# Patient Record
Sex: Female | Born: 1935 | Race: White | Hispanic: No | Marital: Married | Smoking: Never smoker
Health system: Southern US, Community
[De-identification: ages and names within clinical notes are randomized; demographics above are authoritative.]

## PROBLEM LIST (undated history)

## (undated) DIAGNOSIS — D649 Anemia, unspecified: Secondary | ICD-10-CM

## (undated) DIAGNOSIS — R413 Other amnesia: Secondary | ICD-10-CM

## (undated) DIAGNOSIS — F0280 Dementia in other diseases classified elsewhere without behavioral disturbance: Secondary | ICD-10-CM

## (undated) DIAGNOSIS — E538 Deficiency of other specified B group vitamins: Secondary | ICD-10-CM

## (undated) DIAGNOSIS — G9341 Metabolic encephalopathy: Secondary | ICD-10-CM

## (undated) DIAGNOSIS — I2510 Atherosclerotic heart disease of native coronary artery without angina pectoris: Secondary | ICD-10-CM

## (undated) DIAGNOSIS — M1990 Unspecified osteoarthritis, unspecified site: Secondary | ICD-10-CM

## (undated) DIAGNOSIS — I10 Essential (primary) hypertension: Secondary | ICD-10-CM

## (undated) DIAGNOSIS — R41841 Cognitive communication deficit: Secondary | ICD-10-CM

## (undated) DIAGNOSIS — R1310 Dysphagia, unspecified: Secondary | ICD-10-CM

## (undated) DIAGNOSIS — E785 Hyperlipidemia, unspecified: Secondary | ICD-10-CM

## (undated) DIAGNOSIS — IMO0002 Reserved for concepts with insufficient information to code with codable children: Secondary | ICD-10-CM

## (undated) HISTORY — DX: Other amnesia: R41.3

## (undated) HISTORY — DX: Unspecified osteoarthritis, unspecified site: M19.90

## (undated) HISTORY — DX: Essential (primary) hypertension: I10

## (undated) HISTORY — DX: Atherosclerotic heart disease of native coronary artery without angina pectoris: I25.10

## (undated) HISTORY — DX: Deficiency of other specified B group vitamins: E53.8

## (undated) HISTORY — DX: Reserved for concepts with insufficient information to code with codable children: IMO0002

## (undated) HISTORY — DX: Hyperlipidemia, unspecified: E78.5

---

## 1990-12-15 HISTORY — PX: CARDIAC CATHETERIZATION: SHX172

## 1999-03-19 ENCOUNTER — Other Ambulatory Visit: Admission: RE | Admit: 1999-03-19 | Discharge: 1999-03-19 | Payer: Self-pay | Admitting: *Deleted

## 1999-04-08 ENCOUNTER — Other Ambulatory Visit: Admission: RE | Admit: 1999-04-08 | Discharge: 1999-04-08 | Payer: Self-pay | Admitting: *Deleted

## 2000-08-31 ENCOUNTER — Other Ambulatory Visit: Admission: RE | Admit: 2000-08-31 | Discharge: 2000-08-31 | Payer: Self-pay | Admitting: *Deleted

## 2000-10-28 ENCOUNTER — Encounter: Payer: Self-pay | Admitting: Orthopedic Surgery

## 2000-10-30 ENCOUNTER — Ambulatory Visit (HOSPITAL_COMMUNITY): Admission: RE | Admit: 2000-10-30 | Discharge: 2000-10-30 | Payer: Self-pay | Admitting: Orthopedic Surgery

## 2001-11-01 ENCOUNTER — Ambulatory Visit (HOSPITAL_COMMUNITY): Admission: RE | Admit: 2001-11-01 | Discharge: 2001-11-01 | Payer: Self-pay | Admitting: *Deleted

## 2001-11-01 ENCOUNTER — Encounter: Payer: Self-pay | Admitting: *Deleted

## 2004-06-21 ENCOUNTER — Ambulatory Visit (HOSPITAL_COMMUNITY): Admission: RE | Admit: 2004-06-21 | Discharge: 2004-06-21 | Payer: Self-pay | Admitting: Cardiology

## 2004-10-15 ENCOUNTER — Ambulatory Visit: Payer: Self-pay | Admitting: Family Medicine

## 2004-10-31 ENCOUNTER — Ambulatory Visit: Payer: Self-pay | Admitting: Family Medicine

## 2004-11-12 ENCOUNTER — Ambulatory Visit: Payer: Self-pay | Admitting: Family Medicine

## 2004-11-26 ENCOUNTER — Ambulatory Visit: Payer: Self-pay | Admitting: *Deleted

## 2004-12-05 ENCOUNTER — Ambulatory Visit: Payer: Self-pay | Admitting: Internal Medicine

## 2005-01-13 ENCOUNTER — Ambulatory Visit: Payer: Self-pay | Admitting: Family Medicine

## 2005-01-21 ENCOUNTER — Ambulatory Visit: Payer: Self-pay | Admitting: Family Medicine

## 2005-02-04 ENCOUNTER — Ambulatory Visit: Payer: Self-pay | Admitting: Family Medicine

## 2005-04-02 ENCOUNTER — Ambulatory Visit: Payer: Self-pay | Admitting: *Deleted

## 2005-04-29 ENCOUNTER — Ambulatory Visit: Payer: Self-pay | Admitting: Cardiology

## 2005-05-01 ENCOUNTER — Ambulatory Visit: Payer: Self-pay | Admitting: Cardiology

## 2005-10-14 ENCOUNTER — Ambulatory Visit: Payer: Self-pay | Admitting: *Deleted

## 2005-10-21 ENCOUNTER — Ambulatory Visit: Payer: Self-pay | Admitting: *Deleted

## 2005-10-23 ENCOUNTER — Ambulatory Visit: Payer: Self-pay | Admitting: Cardiology

## 2005-10-28 ENCOUNTER — Ambulatory Visit: Payer: Self-pay | Admitting: *Deleted

## 2006-04-07 ENCOUNTER — Ambulatory Visit: Payer: Self-pay | Admitting: *Deleted

## 2006-04-20 ENCOUNTER — Ambulatory Visit: Payer: Self-pay | Admitting: *Deleted

## 2006-04-23 ENCOUNTER — Ambulatory Visit: Payer: Self-pay | Admitting: Cardiology

## 2006-09-30 ENCOUNTER — Ambulatory Visit: Payer: Self-pay | Admitting: *Deleted

## 2006-10-19 ENCOUNTER — Ambulatory Visit: Payer: Self-pay | Admitting: *Deleted

## 2006-10-19 LAB — CONVERTED CEMR LAB
ALT: 19 units/L (ref 0–40)
AST: 21 units/L (ref 0–37)
Albumin: 3.6 g/dL (ref 3.5–5.2)
Alkaline Phosphatase: 106 units/L (ref 39–117)
BUN: 16 mg/dL (ref 6–23)
Bilirubin, Direct: 0.1 mg/dL (ref 0.0–0.3)
CO2: 28 meq/L (ref 19–32)
Calcium: 9.5 mg/dL (ref 8.4–10.5)
Chloride: 105 meq/L (ref 96–112)
Chol/HDL Ratio, serum: 3.1
Cholesterol: 165 mg/dL (ref 0–200)
Creatinine, Ser: 0.9 mg/dL (ref 0.4–1.2)
GFR calc non Af Amer: 65 mL/min
Glomerular Filtration Rate, Af Am: 78 mL/min/{1.73_m2}
Glucose, Bld: 110 mg/dL — ABNORMAL HIGH (ref 70–99)
HDL: 53.6 mg/dL (ref >39.0)
LDL Cholesterol: 89 mg/dL (ref 0–99)
Potassium: 3.8 meq/L (ref 3.5–5.1)
Sodium: 140 meq/L (ref 135–145)
Total Bilirubin: 0.9 mg/dL (ref 0.3–1.2)
Total Protein: 6.6 g/dL (ref 6.0–8.3)
Triglyceride fasting, serum: 112 mg/dL (ref 0–149)
VLDL: 22 mg/dL (ref 0–40)

## 2006-10-22 ENCOUNTER — Ambulatory Visit: Payer: Self-pay | Admitting: Internal Medicine

## 2006-10-27 ENCOUNTER — Ambulatory Visit: Payer: Self-pay | Admitting: *Deleted

## 2007-01-25 ENCOUNTER — Ambulatory Visit: Payer: Self-pay | Admitting: *Deleted

## 2007-04-14 ENCOUNTER — Ambulatory Visit: Payer: Self-pay | Admitting: *Deleted

## 2007-04-14 LAB — CONVERTED CEMR LAB
ALT: 18 units/L (ref 0–40)
AST: 20 units/L (ref 0–37)
Albumin: 3.8 g/dL (ref 3.5–5.2)
Alkaline Phosphatase: 121 units/L — ABNORMAL HIGH (ref 39–117)
BUN: 13 mg/dL (ref 6–23)
Bilirubin, Direct: 0.2 mg/dL (ref 0.0–0.3)
CO2: 29 meq/L (ref 19–32)
Calcium: 9.1 mg/dL (ref 8.4–10.5)
Chloride: 103 meq/L (ref 96–112)
Cholesterol: 164 mg/dL (ref 0–200)
Creatinine, Ser: 0.9 mg/dL (ref 0.4–1.2)
GFR calc Af Amer: 78 mL/min
GFR calc non Af Amer: 65 mL/min
Glucose, Bld: 116 mg/dL — ABNORMAL HIGH (ref 70–99)
HDL: 49.2 mg/dL (ref >39.0)
LDL Cholesterol: 87 mg/dL (ref 0–99)
Potassium: 3.7 meq/L (ref 3.5–5.1)
Sodium: 142 meq/L (ref 135–145)
Total Bilirubin: 1.1 mg/dL (ref 0.3–1.2)
Total CHOL/HDL Ratio: 3.3
Total Protein: 6.7 g/dL (ref 6.0–8.3)
Triglycerides: 138 mg/dL (ref 0–149)
VLDL: 28 mg/dL (ref 0–40)

## 2007-04-19 ENCOUNTER — Ambulatory Visit: Payer: Self-pay | Admitting: Cardiovascular Disease

## 2007-04-27 ENCOUNTER — Ambulatory Visit: Payer: Self-pay | Admitting: *Deleted

## 2007-06-09 ENCOUNTER — Ambulatory Visit: Payer: Self-pay | Admitting: Cardiology

## 2007-06-09 LAB — CONVERTED CEMR LAB
ALT: 15 units/L (ref 0–40)
AST: 22 units/L (ref 0–37)
Albumin: 3.5 g/dL (ref 3.5–5.2)
Alkaline Phosphatase: 125 units/L — ABNORMAL HIGH (ref 39–117)
Bilirubin, Direct: 0.2 mg/dL (ref 0.0–0.3)
Cholesterol: 196 mg/dL (ref 0–200)
HDL: 53.9 mg/dL (ref >39.0)
LDL Cholesterol: 119 mg/dL — ABNORMAL HIGH (ref 0–99)
Total Bilirubin: 1.2 mg/dL (ref 0.3–1.2)
Total CHOL/HDL Ratio: 3.6
Total Protein: 7 g/dL (ref 6.0–8.3)
Triglycerides: 114 mg/dL (ref 0–149)
VLDL: 23 mg/dL (ref 0–40)

## 2007-08-30 ENCOUNTER — Encounter: Payer: Self-pay | Admitting: Family Medicine

## 2007-09-02 ENCOUNTER — Ambulatory Visit: Payer: Self-pay | Admitting: Cardiology

## 2007-10-14 ENCOUNTER — Ambulatory Visit: Payer: Self-pay | Admitting: Cardiology

## 2007-10-14 LAB — CONVERTED CEMR LAB
ALT: 18 units/L (ref 0–35)
AST: 21 units/L (ref 0–37)
Albumin: 3.8 g/dL (ref 3.5–5.2)
Alkaline Phosphatase: 118 units/L — ABNORMAL HIGH (ref 39–117)
Bilirubin, Direct: 0.1 mg/dL (ref 0.0–0.3)
Cholesterol: 148 mg/dL (ref 0–200)
HDL: 55.3 mg/dL (ref >39.0)
LDL Cholesterol: 73 mg/dL (ref 0–99)
Total Bilirubin: 1 mg/dL (ref 0.3–1.2)
Total CHOL/HDL Ratio: 2.7
Total Protein: 6.9 g/dL (ref 6.0–8.3)
Triglycerides: 98 mg/dL (ref 0–149)
VLDL: 20 mg/dL (ref 0–40)

## 2007-10-21 ENCOUNTER — Ambulatory Visit: Payer: Self-pay | Admitting: Cardiology

## 2007-10-29 ENCOUNTER — Ambulatory Visit: Payer: Self-pay | Admitting: Cardiology

## 2007-10-29 LAB — CONVERTED CEMR LAB
ALT: 17 units/L (ref 0–35)
AST: 21 units/L (ref 0–37)
Albumin: 3.7 g/dL (ref 3.5–5.2)
Alkaline Phosphatase: 123 units/L — ABNORMAL HIGH (ref 39–117)
Bilirubin, Direct: 0.2 mg/dL (ref 0.0–0.3)
Cholesterol: 171 mg/dL (ref 0–200)
HDL: 53.7 mg/dL (ref >39.0)
LDL Cholesterol: 95 mg/dL (ref 0–99)
Total Bilirubin: 1 mg/dL (ref 0.3–1.2)
Total CHOL/HDL Ratio: 3.2
Total Protein: 6.9 g/dL (ref 6.0–8.3)
Triglycerides: 113 mg/dL (ref 0–149)
VLDL: 23 mg/dL (ref 0–40)

## 2008-02-18 ENCOUNTER — Ambulatory Visit: Payer: Self-pay | Admitting: Cardiology

## 2008-02-18 LAB — CONVERTED CEMR LAB
ALT: 19 units/L (ref 0–35)
AST: 22 units/L (ref 0–37)
Albumin: 4.1 g/dL (ref 3.5–5.2)
Alkaline Phosphatase: 114 units/L (ref 39–117)
BUN: 22 mg/dL (ref 6–23)
Bilirubin, Direct: 0.1 mg/dL (ref 0.0–0.3)
CO2: 28 meq/L (ref 19–32)
Calcium: 9.9 mg/dL (ref 8.4–10.5)
Chloride: 104 meq/L (ref 96–112)
Cholesterol: 162 mg/dL (ref 0–200)
Creatinine, Ser: 1.1 mg/dL (ref 0.4–1.2)
GFR calc Af Amer: 62 mL/min
GFR calc non Af Amer: 51 mL/min
Glucose, Bld: 105 mg/dL — ABNORMAL HIGH (ref 70–99)
HDL: 55.6 mg/dL (ref >39.0)
LDL Cholesterol: 80 mg/dL (ref 0–99)
Potassium: 3.9 meq/L (ref 3.5–5.1)
Sodium: 138 meq/L (ref 135–145)
Total Bilirubin: 1.2 mg/dL (ref 0.3–1.2)
Total CHOL/HDL Ratio: 2.9
Total Protein: 7.3 g/dL (ref 6.0–8.3)
Triglycerides: 131 mg/dL (ref 0–149)
VLDL: 26 mg/dL (ref 0–40)

## 2008-04-06 ENCOUNTER — Ambulatory Visit: Payer: Self-pay | Admitting: Cardiology

## 2008-06-23 ENCOUNTER — Ambulatory Visit: Payer: Self-pay | Admitting: Cardiology

## 2008-06-23 LAB — CONVERTED CEMR LAB
ALT: 17 units/L (ref 0–35)
AST: 19 units/L (ref 0–37)
Albumin: 3.6 g/dL (ref 3.5–5.2)
Alkaline Phosphatase: 128 units/L — ABNORMAL HIGH (ref 39–117)
Bilirubin, Direct: 0.1 mg/dL (ref 0.0–0.3)
Cholesterol: 152 mg/dL (ref 0–200)
HDL: 56.5 mg/dL (ref >39.0)
LDL Cholesterol: 77 mg/dL (ref 0–99)
Total Bilirubin: 1.1 mg/dL (ref 0.3–1.2)
Total CHOL/HDL Ratio: 2.7
Total Protein: 6.7 g/dL (ref 6.0–8.3)
Triglycerides: 92 mg/dL (ref 0–149)
VLDL: 18 mg/dL (ref 0–40)

## 2008-06-29 ENCOUNTER — Ambulatory Visit: Payer: Self-pay | Admitting: Cardiology

## 2008-07-12 ENCOUNTER — Encounter: Admission: RE | Admit: 2008-07-12 | Discharge: 2008-07-12 | Payer: Self-pay | Admitting: Orthopedic Surgery

## 2008-09-26 ENCOUNTER — Ambulatory Visit: Payer: Self-pay | Admitting: Family Medicine

## 2008-10-23 ENCOUNTER — Ambulatory Visit: Payer: Self-pay | Admitting: Cardiology

## 2008-12-18 ENCOUNTER — Ambulatory Visit: Payer: Self-pay | Admitting: Cardiology

## 2008-12-18 LAB — CONVERTED CEMR LAB
AST: 19 units/L (ref 0–37)
Alkaline Phosphatase: 127 units/L — ABNORMAL HIGH (ref 39–117)
Bilirubin, Direct: 0.2 mg/dL (ref 0.0–0.3)
LDL Cholesterol: 83 mg/dL (ref 0–99)
Total Bilirubin: 1.2 mg/dL (ref 0.3–1.2)
Total CHOL/HDL Ratio: 2.9
Triglycerides: 105 mg/dL (ref 0–149)

## 2008-12-21 ENCOUNTER — Ambulatory Visit: Payer: Self-pay | Admitting: Cardiovascular Disease

## 2009-02-19 ENCOUNTER — Ambulatory Visit: Payer: Self-pay | Admitting: Cardiology

## 2009-02-19 LAB — CONVERTED CEMR LAB
ALT: 26 units/L (ref 0–35)
Bilirubin, Direct: 0.3 mg/dL (ref 0.0–0.3)
HDL: 47.8 mg/dL (ref >39.0)
LDL Cholesterol: 70 mg/dL (ref 0–99)
Total Bilirubin: 1.5 mg/dL — ABNORMAL HIGH (ref 0.3–1.2)
Total CHOL/HDL Ratio: 2.9
VLDL: 24 mg/dL (ref 0–40)

## 2009-02-22 ENCOUNTER — Ambulatory Visit: Payer: Self-pay | Admitting: Cardiovascular Disease

## 2009-02-22 ENCOUNTER — Encounter: Payer: Self-pay | Admitting: Cardiology

## 2009-05-08 ENCOUNTER — Encounter (INDEPENDENT_AMBULATORY_CARE_PROVIDER_SITE_OTHER): Payer: Self-pay | Admitting: *Deleted

## 2009-05-10 ENCOUNTER — Ambulatory Visit: Payer: Self-pay | Admitting: Family Medicine

## 2009-05-10 DIAGNOSIS — E785 Hyperlipidemia, unspecified: Secondary | ICD-10-CM | POA: Insufficient documentation

## 2009-05-10 DIAGNOSIS — IMO0002 Reserved for concepts with insufficient information to code with codable children: Secondary | ICD-10-CM

## 2009-05-10 DIAGNOSIS — R413 Other amnesia: Secondary | ICD-10-CM

## 2009-05-10 DIAGNOSIS — I10 Essential (primary) hypertension: Secondary | ICD-10-CM | POA: Insufficient documentation

## 2009-05-10 DIAGNOSIS — M1990 Unspecified osteoarthritis, unspecified site: Secondary | ICD-10-CM | POA: Insufficient documentation

## 2009-05-10 LAB — CONVERTED CEMR LAB
BUN: 17 mg/dL (ref 6–23)
Basophils Absolute: 0.1 10*3/uL (ref 0.0–0.1)
Bilirubin Urine: NEGATIVE
Blood in Urine, dipstick: NEGATIVE
Cholesterol: 130 mg/dL (ref 0–200)
Creatinine, Ser: 0.9 mg/dL (ref 0.4–1.2)
Folate: 7.2 ng/mL
GFR calc non Af Amer: 64.16 mL/min (ref >60)
Glucose, Bld: 151 mg/dL — ABNORMAL HIGH (ref 70–99)
Glucose, Urine, Semiquant: NEGATIVE
HCT: 38 % (ref 36.0–46.0)
Iron: 45 ug/dL (ref 42–145)
Ketones, urine, test strip: NEGATIVE
Lymphs Abs: 0.8 10*3/uL (ref 0.7–4.0)
MCV: 90.9 fL (ref 78.0–100.0)
Monocytes Absolute: 0.4 10*3/uL (ref 0.1–1.0)
Monocytes Relative: 7.4 % (ref 3.0–12.0)
Neutrophils Relative %: 75 % (ref 43.0–77.0)
Platelets: 245 10*3/uL (ref 150.0–400.0)
Potassium: 3.2 meq/L — ABNORMAL LOW (ref 3.5–5.1)
Protein, U semiquant: NEGATIVE
RDW: 13.4 % (ref 11.5–14.6)
TSH: 1.31 microintl units/mL (ref 0.35–5.50)
Total Bilirubin: 1.5 mg/dL — ABNORMAL HIGH (ref 0.3–1.2)
Transferrin: 236.1 mg/dL (ref 212.0–360.0)
Triglycerides: 89 mg/dL (ref 0.0–149.0)
Urobilinogen, UA: 0.2
VLDL: 17.8 mg/dL (ref 0.0–40.0)
Vitamin B-12: 265 pg/mL (ref 211–911)
pH: 5

## 2009-05-15 ENCOUNTER — Ambulatory Visit: Payer: Self-pay | Admitting: Family Medicine

## 2009-05-15 DIAGNOSIS — E538 Deficiency of other specified B group vitamins: Secondary | ICD-10-CM

## 2009-07-13 ENCOUNTER — Ambulatory Visit: Payer: Self-pay | Admitting: Cardiology

## 2009-08-17 ENCOUNTER — Ambulatory Visit: Payer: Self-pay | Admitting: Cardiology

## 2009-08-21 ENCOUNTER — Ambulatory Visit: Payer: Self-pay | Admitting: Family Medicine

## 2009-08-21 LAB — CONVERTED CEMR LAB
AST: 21 units/L (ref 0–37)
Alkaline Phosphatase: 126 units/L — ABNORMAL HIGH (ref 39–117)
Bilirubin, Direct: 0.1 mg/dL (ref 0.0–0.3)
CO2: 25 meq/L (ref 19–32)
Calcium: 9 mg/dL (ref 8.4–10.5)
Creatinine, Ser: 1 mg/dL (ref 0.4–1.2)
Glucose, Bld: 84 mg/dL (ref 70–99)
Sodium: 135 meq/L (ref 135–145)
Total Bilirubin: 0.9 mg/dL (ref 0.3–1.2)
Total CHOL/HDL Ratio: 2

## 2009-08-30 ENCOUNTER — Ambulatory Visit: Payer: Self-pay | Admitting: Cardiology

## 2009-09-05 ENCOUNTER — Encounter: Payer: Self-pay | Admitting: Family Medicine

## 2009-09-28 ENCOUNTER — Ambulatory Visit: Payer: Self-pay | Admitting: Family Medicine

## 2009-12-17 ENCOUNTER — Telehealth: Payer: Self-pay | Admitting: Family Medicine

## 2010-02-21 ENCOUNTER — Ambulatory Visit: Payer: Self-pay | Admitting: Cardiology

## 2010-02-21 LAB — CONVERTED CEMR LAB
Bilirubin, Direct: 0.2 mg/dL (ref 0.0–0.3)
Cholesterol: 138 mg/dL (ref 0–200)
LDL Cholesterol: 52 mg/dL (ref 0–99)
Total Protein: 6.7 g/dL (ref 6.0–8.3)
VLDL: 20.2 mg/dL (ref 0.0–40.0)

## 2010-02-28 ENCOUNTER — Ambulatory Visit: Payer: Self-pay | Admitting: Internal Medicine

## 2010-04-02 ENCOUNTER — Telehealth: Payer: Self-pay | Admitting: Family Medicine

## 2010-04-15 ENCOUNTER — Telehealth: Payer: Self-pay | Admitting: Family Medicine

## 2010-05-06 ENCOUNTER — Telehealth: Payer: Self-pay | Admitting: Family Medicine

## 2010-06-27 ENCOUNTER — Telehealth: Payer: Self-pay | Admitting: Cardiology

## 2010-07-03 ENCOUNTER — Telehealth: Payer: Self-pay | Admitting: Family Medicine

## 2010-07-23 ENCOUNTER — Ambulatory Visit: Payer: Self-pay | Admitting: Cardiology

## 2010-08-12 ENCOUNTER — Ambulatory Visit: Payer: Self-pay | Admitting: Cardiology

## 2010-08-14 LAB — CONVERTED CEMR LAB
ALT: 14 units/L (ref 0–35)
AST: 18 units/L (ref 0–37)
Albumin: 3.5 g/dL (ref 3.5–5.2)
HDL: 55.8 mg/dL (ref >39.00)
Total CHOL/HDL Ratio: 3
Total Protein: 6.3 g/dL (ref 6.0–8.3)
Triglycerides: 113 mg/dL (ref 0.0–149.0)

## 2010-08-15 ENCOUNTER — Ambulatory Visit: Payer: Self-pay | Admitting: Internal Medicine

## 2010-09-05 ENCOUNTER — Ambulatory Visit: Payer: Self-pay | Admitting: Family Medicine

## 2010-09-10 ENCOUNTER — Encounter: Payer: Self-pay | Admitting: Family Medicine

## 2010-09-11 ENCOUNTER — Encounter: Payer: Self-pay | Admitting: Family Medicine

## 2011-01-16 NOTE — Assessment & Plan Note (Signed)
Summary: rov lipid - [facility]   Visit Type:  Follow-up Primary Provider:  Roderick Pee MD  CC:  dyslipidemia follow-up.  History of Present Illness:     Lipid Clinic Visit      The patient comes in today for dyslipidemia follow-up.  The patient has no complaints of medication problems, chest pain, shortness of breath, muscle aches, and muscle cramps.  She is compliant with Lipitor 80mg  and Zetia 10mg .   Dietary compliance review reveals pt does a consistent job of eating 5 or more fruits and vegetables and limiting fats and TFA's.  Her diet is relatively stable.  She does cereal for breakfast, soups and sandwiches for lunch, then often a buffet for dinner.  Her husband and her won free meals at the [employer].   Review of exercise habits reveals that the patient is not exercising.  She has OA in her knee and surgery is not an option at this time.   Lipid Management Provider  Weston Brass, PharmD  Current Medications (verified): 1)  Ramipril 10 Mg Caps (Ramipril) .... Take 2 Tabs Once Daily 2)  Potassium Chloride Crys Cr 20 Meq Cr-Tabs (Potassium Chloride Crys Cr) .... 3 Daily 3)  Hydrochlorothiazide 25 Mg Tabs (Hydrochlorothiazide) .... Take One Tab Once Daily 4)  Lipitor 80 Mg Tabs (Atorvastatin Calcium) .... Take One Tab At Bedtime 5)  Nabumetone 500 Mg Tabs (Nabumetone) .... Take One Tab Two Times A Day 6)  Zetia 10 Mg Tabs (Ezetimibe) .... Take One Tab Once Daily 7)  Adult Aspirin Ec Low Strength 81 Mg Tbec (Aspirin) .... Once Daily 8)  Centrum Silver Ultra Womens  Tabs (Multiple Vitamins-Minerals) .... Once Daily 9)  Calcium-Vitamin D 600-125 Mg-Unit Tabs (Calcium-Vitamin D) .... Once Daily 10)  Cyanocobalamin 1000 Mcg/ml Soln (Cyanocobalamin) .Marland Kitchen.. 1 Cc Weekly X 2 Mo. , Then 1 Cc Monthly 11)  Bd Eclipse Syringe 27g X 1/2" 1 Ml Misc (Syringe/needle (Disp)) .... Uad For B12 Shots 12)  Aricept 10 Mg Tabs (Donepezil Hcl) .... Take 1 Tablet By Mouth Every Night 13)  Namenda 10 Mg Tabs  (Memantine Hcl) .Marland Kitchen.. 1 Tab At Bedtime  Allergies (verified): No Known Drug Allergies  Past History:  Risk Factors: Alcohol Use: 0 (02/28/2010) Caffeine Use: 1 cup soda with lunch (02/28/2010) Exercise: no (05/10/2009)  Risk Factors: Smoking Status: never (02/28/2010)   Vital Signs:  Patient profile:   77 year old female Menstrual status:  postmenopausal Height:      65 inches Weight:      232 pounds BMI:     38.75 Pulse rate:   60 / minute BP sitting:   138 / 72  Impression & Recommendations:  Problem # 1:  HYPERLIPIDEMIA (ICD-272.4) Assessment Unchanged Pt's cholesterol very well controlled.  TC- 161 (goal<200), TG- 113 (goal<150), HDL- 55.8 (goal>45), LDL- 83 (goal<70).  Pt's cholesterol has been stable for past several checks.  She is compliant with medication and tolerating with no problems.  She maintains a heart healthy diet.  Despite not being able to exercise, her HDL remains above goal.  Discussed with pt the option of f/u with Dr. Daleen Squibb for future lipid management.  Pt is aggreable to this.  Will make Dr. Daleen Squibb aware and f/u labs with his appt next year.    Her updated medication list for this problem includes:    Lipitor 80 Mg Tabs (Atorvastatin calcium) .Marland Kitchen... Take one tab at bedtime    Zetia 10 Mg Tabs (Ezetimibe) .Marland Kitchen... Take one tab  once daily  Patient Instructions: 1)  Your labs look great.   2)  Continue Lipitor 80mg  and Zetia 10mg  daily 3)  Continue your current low-carbohydrate, low-fat diet. 4)  Will f/u with Dr. Daleen Squibb for future cholesterol checks.

## 2011-01-16 NOTE — Progress Notes (Signed)
Summary: referral to neurologist  Phone Note Call from Patient   Caller: Daughter Call For: Roderick Pee MD Summary of Call: Marella Bile (pts' daughter) wants to talk to [name] regarding a referral to a neurologist for increasing  dementia. [phone] Initial call taken by: Lynann Beaver CMA,  December 17, 2009 11:46 AM  Follow-up for Phone Call        carmen domeir at [place].  Neurologic Follow-up by: Roderick Pee MD,  December 17, 2009 1:10 PM  Additional Follow-up for Phone Call Additional follow up Details #1::        Faxed order to [facility]. Additional Follow-up by: Corky Mull,  December 18, 2009 8:39 AM

## 2011-01-16 NOTE — Progress Notes (Signed)
Summary: refill  Phone Note Refill Request Message from:  Fax from Pharmacy on May 06, 2010 5:02 PM  Refills Requested: Medication #1:  RAMIPRIL 10 MG CAPS take 2 tabs once daily Initial call taken by: Kern Reap CMA Duncan Dull),  May 06, 2010 5:02 PM    Prescriptions: ARICEPT 10 MG TABS (DONEPEZIL HCL) Take 1 tablet by mouth every night  #100 x 3   Entered by:   Kern Reap CMA (AAMA)   Authorized by:   Roderick Pee MD   Signed by:   Kern Reap CMA (AAMA) on 05/06/2010   Method used:   Electronically to        [facility] ([facility])       [facility]       [address]       [place], [place]  [address]       Ph: [phone]       Fax: [phone]   RxID:   [id number]

## 2011-01-16 NOTE — Assessment & Plan Note (Signed)
Summary: yearly/sl  Medications Added NAMENDA 5 MG TABS (MEMANTINE HCL) 1 tab at bedtime NAMENDA 10 MG TABS (MEMANTINE HCL) 1 tab at bedtime      Allergies Added: NKDA  Visit Type:  77 yr f/u Primary Provider:  Roderick Pee MD  CC:  no cardiac complaints today.  History of Present Illness: [name] returns for evaluation management of nonobstructive coronary disease. Her mobility is limited though she is complaining of no angina or ischemic symptoms. She denies orthopnea, PND or edema.  Her blood pressure and her hyperlipidemia being followed by [name].  Current Medications (verified): 1)  Ramipril 10 Mg Caps (Ramipril) .... Take 2 Tabs Once Daily 2)  Potassium Chloride Crys Cr 20 Meq Cr-Tabs (Potassium Chloride Crys Cr) .... 3 Daily 3)  Hydrochlorothiazide 25 Mg Tabs (Hydrochlorothiazide) .... Take One Tab Once Daily 4)  Lipitor 80 Mg Tabs (Atorvastatin Calcium) .... Take One Tab At Bedtime 5)  Nabumetone 500 Mg Tabs (Nabumetone) .... Take One Tab Two Times A Day 6)  Zetia 10 Mg Tabs (Ezetimibe) .... Take One Tab Once Daily 7)  Adult Aspirin Ec Low Strength 81 Mg Tbec (Aspirin) .... Once Daily 8)  Centrum Silver Ultra Womens  Tabs (Multiple Vitamins-Minerals) .... Once Daily 9)  Calcium-Vitamin D 600-125 Mg-Unit Tabs (Calcium-Vitamin D) .... Once Daily 10)  Cyanocobalamin 1000 Mcg/ml Soln (Cyanocobalamin) .Marland Kitchen.. 1 Cc Weekly X 2 Mo. , Then 1 Cc Monthly 11)  Bd Eclipse Syringe 27g X 1/2" 1 Ml Misc (Syringe/needle (Disp)) .... Uad For B12 Shots 12)  Aricept 10 Mg Tabs (Donepezil Hcl) .... Take 1 Tablet By Mouth Every Night 13)  Namenda 5 Mg Tabs (Memantine Hcl) .Marland Kitchen.. 1 Tab At Bedtime 14)  Namenda 10 Mg Tabs (Memantine Hcl) .Marland Kitchen.. 1 Tab At Bedtime  Allergies (verified): No Known Drug Allergies  Past History:  Past Medical History: Last updated: 07/03/2009 CAD, UNSPECIFIED SITE/NONOBSTRUCTIVE (ICD-414.00) HYPERLIPIDEMIA (ICD-272.4) HYPERTENSION NEC (ICD-997.91) VITAMIN B12  DEFICIENCY (ICD-266.2) LOC OSTEOARTHROS NOT SPEC PRIM/SEC UNSPEC SITE (ICD-715.30) MEMORY LOSS (ICD-780.93)    Past Surgical History: Last updated: 07/03/2009 noncontributory  Family History: Last updated: 07/03/2009 noncontributory  Social History: Last updated: 05/10/2009 Retired Married Never Smoked Alcohol use-no Drug use-no Regular exercise-no  Risk Factors: Alcohol Use: 0 (02/28/2010) Caffeine Use: 1 cup soda with lunch (02/28/2010) Exercise: no (05/10/2009)  Risk Factors: Smoking Status: never (02/28/2010)  Clinical Reports Reviewed:  Cardiac Cath:  07/25/1991: Cardiac Cath Findings:  Catheterization Diagnosis: One vessel coronary artery disease is demonstrated. The disease involves the left anterior descending artery minimally. LAD: Proximal 20 percent.  Comments: 77 yo white married female with recent chest pain, palpitations. Normal left main, LAD normal excepts mild irregularities, mid LAD. CFX normal, RCA normal, LV normal.  [name], MD   Review of Systems       negative other than history of present illness  Vital Signs:  Patient profile:   77 year old female Menstrual status:  postmenopausal Height:      65 inches Weight:      226 pounds BMI:     37.74 Pulse rate:   63 / minute Pulse rhythm:   regular BP sitting:   112 / 70  (left arm) Cuff size:   large  Vitals Entered By: Danielle Rankin, CMA (July 23, 2010 10:17 AM)  Physical Exam  General:  obese.   Head:  normocephalic and atraumatic Eyes:  PERRLA/EOM intact; conjunctiva and lids normal. Neck:  Neck supple, no JVD. No masses,  thyromegaly or abnormal cervical nodes. Chest Wall:  no deformities or breast masses noted Lungs:  Clear bilaterally to auscultation and percussion. Heart:  PMI difficult to appreciate, normal S1-S2, no gallop or rub. Msk:  decreased ROM.   Pulses:  pulses normal in all 4 extremities Extremities:  varicose veins and no sign of DVTtrace left pedal  edema and trace right pedal edema.   Neurologic:  Alert and oriented x 3. Skin:  Intact without lesions or rashes. Psych:  Normal affect.   EKG  Procedure date:  07/23/2010  Findings:      normal sinus rhythm, low voltage QRS.  Impression & Recommendations:  Problem # 1:  CAD, UNSPECIFIED SITE/NONOBSTRUCTIVE (ICD-414.00) Assessment Unchanged  Her updated medication list for this problem includes:    Ramipril 10 Mg Caps (Ramipril) .Marland Kitchen... Take 2 tabs once daily    Adult Aspirin Ec Low Strength 81 Mg Tbec (Aspirin) ..... Once daily  Orders: EKG w/ Interpretation (93000)  Problem # 2:  HYPERTENSION NEC (ICD-997.91)  Orders: EKG w/ Interpretation (93000)  Problem # 3:  HYPERLIPIDEMIA (ICD-272.4)  Her updated medication list for this problem includes:    Lipitor 80 Mg Tabs (Atorvastatin calcium) .Marland Kitchen... Take one tab at bedtime    Zetia 10 Mg Tabs (Ezetimibe) .Marland Kitchen... Take one tab once daily  Orders: EKG w/ Interpretation (93[address])  Patient Instructions: 1)  Your physician recommends that you schedule a follow-up appointment in: 12 months with Dr. Daleen Squibb 2)  Your physician recommends that you continue on your current medications as directed. Please refer to the Current Medication list given to you today.

## 2011-01-16 NOTE — Progress Notes (Signed)
Summary: Request call about medication   Phone Note Call from Patient Call back at Home Phone [phone]   Caller: [name] Summary of Call: Pt request call regarding a medication( HCTZ) Initial call taken by: Judie Grieve,  June 27, 2010 11:07 AM  Follow-up for Phone Call        SPOKE WITH [name] STATED WIFE WAS ALMOST OUT OF HCTZ 30 TABS [facility] [address] [facility]LL [facility]  A [facility] DAY SUPPLY [facility] AND WHILE TALKING [name] STATED HE NEEDED  DIGOXIN FILLED [facility] DAYS [facility]ENT TO [facility]. Follow-up by: Scherrie Bateman, LPN,  June 27, 2010 11:21 AM     Appended Document: Request call about medication  Reviewed Juanito Doom, MD

## 2011-01-16 NOTE — Progress Notes (Signed)
Summary: nabumetone refill  Phone Note Refill Request Message from:  Fax from Pharmacy on July 03, 2010 10:07 AM  Refills Requested: Medication #1:  NABUMETONE 500 MG TABS take one tab two times a day Initial call taken by: Kern Reap CMA Duncan Dull),  July 03, 2010 10:07 AM    Prescriptions: NABUMETONE 500 MG TABS (NABUMETONE) take one tab two times a day  #200 x 0   Entered by:   Kern Reap CMA (AAMA)   Authorized by:   Roderick Pee MD   Signed by:   Kern Reap CMA (AAMA) on 07/03/2010   Method used:   Electronically to        [facility]       [address]       [place], [place]  [address]       Ph: [phone] or [phone]       Fax: [phone]   RxID:   [id number]

## 2011-01-16 NOTE — Miscellaneous (Signed)
Summary: mammogram update   Clinical Lists Changes  Observations: Added new observation of MAMMOGRAM: abnormal (09/05/2010 12:00)      Preventive Care Screening  Mammogram:    Date:  09/05/2010    Results:  abnormal

## 2011-01-16 NOTE — Assessment & Plan Note (Signed)
Summary: rov lipid- [facility]   Primary Provider:  Roderick Pee MD  CC:  dyslipidemia follow-up.  History of Present Illness:  Lipid Clinic Visit      The patient comes in today for dyslipidemia follow-up.  The patient has no complaints of chest pain, palpitations, shortness of breath, muscle aches, and muscle cramps.  Dietary compliance review reveals an overall grade of eating 5 or more fruits and vegetables, not counting carbohydrates, and not limiting fats and TFA's.  Review of exercise habits reveals that the patient is not exercising due to OA, walks with a cane.  Risk factor modification shows that the patient is not exercising.  Compliance with medication is good.    Mind really slipping - depends on husband to answer questions about daily life...Marland Kitchenon Aricept   Preventive Screening-Counseling & Management  Alcohol-Tobacco     Alcohol drinks/day: 0     Smoking Status: never  Caffeine-Diet-Exercise     Caffeine use/day: 1 cup soda with lunch  Current Medications (verified): 1)  Ramipril 10 Mg Caps (Ramipril) .... Take 2 Tabs Once Daily 2)  Potassium Chloride Crys Cr 20 Meq Cr-Tabs (Potassium Chloride Crys Cr) .... 3 Daily 3)  Hydrochlorothiazide 25 Mg Tabs (Hydrochlorothiazide) .... Take One Tab Once Daily 4)  Lipitor 80 Mg Tabs (Atorvastatin Calcium) .... Take One Tab At Bedtime 5)  Nabumetone 500 Mg Tabs (Nabumetone) .... Take One Tab Two Times A Day 6)  Zetia 10 Mg Tabs (Ezetimibe) .... Take One Tab Once Daily 7)  Adult Aspirin Ec Low Strength 81 Mg Tbec (Aspirin) .... Once Daily 8)  Centrum Silver Ultra Womens  Tabs (Multiple Vitamins-Minerals) .... Once Daily 9)  Calcium-Vitamin D 600-125 Mg-Unit Tabs (Calcium-Vitamin D) .... Once Daily 10)  Cyanocobalamin 1000 Mcg/ml Soln (Cyanocobalamin) .Marland Kitchen.. 1 Cc Weekly X 2 Mo. , Then 1 Cc Monthly 11)  Bd Eclipse Syringe 27g X 1/2" 1 Ml Misc (Syringe/needle (Disp)) .... Uad For B12 Shots 12)  Aricept 5 Mg Tabs (Donepezil Hcl) .Marland Kitchen.. 1 Tab @  Bedtime 13)  Aricept 10 Mg Tabs (Donepezil Hcl) .... Take 1 Tablet By Mouth Every Night  Allergies (verified): No Known Drug Allergies  Social History: Alcohol drinks/day:  0 Caffeine use/day:  1 cup soda with lunch   Vital Signs:  Patient profile:   77 year old female Menstrual status:  postmenopausal Height:      66 inches Weight:      234 pounds Pulse rate:   70 / minute Pulse rhythm:   regular BP sitting:   130 / 70  (left arm) Cuff size:   large  Impression & Recommendations:  Problem # 1:  HYPERLIPIDEMIA (ICD-272.4)  Her updated medication list for this problem includes:    Lipitor 80 Mg Tabs (Atorvastatin calcium) .Marland Kitchen... Take one tab at bedtime    Zetia 10 Mg Tabs (Ezetimibe) .Marland Kitchen... Take one tab once daily Ms Woznick returns to Lipid clinic with no complaints.  no CP, SOB, muscle pains.  Her mind is slipping.  She depends on husband to answer questions about daily life and he gives her meds.  Her diet is stable... grain cereal, ham sandwich with a few chips, salad and meat or chicken  Her lipd panel is at goal... TC 138  at goal < 200   TG 101 at goal < 150   HDL 66 at goal > 40   LDL 52 at goal < 70  f/u 76mo

## 2011-01-16 NOTE — Assessment & Plan Note (Signed)
Summary: flu shot//ccm   Nurse Visit   Allergies: No Known Drug Allergies  Orders Added: 1)  Flu Vaccine 64yrs + MEDICARE PATIENTS [Q2039] 2)  Administration Flu vaccine - MCR [G0008] Flu Vaccine Consent Questions     Do you have a history of severe allergic reactions to this vaccine? no    Any prior history of allergic reactions to egg and/or gelatin? no    Do you have a sensitivity to the preservative Thimersol? no    Do you have a past history of Guillan-Barre Syndrome? no    Do you currently have an acute febrile illness? no    Have you ever had a severe reaction to latex? no    Vaccine information given and explained to patient? yes    Are you currently pregnant? no    Lot Number:[id number]   Exp Date:06/14/2011   Site Given  Left Deltoid IM.lbmedflu

## 2011-01-16 NOTE — Progress Notes (Signed)
Summary: refills  Phone Note Refill Request Message from:  Fax from Pharmacy on April 02, 2010 5:15 PM  Refills Requested: Medication #1:  ZETIA 10 MG TABS take one tab once daily  Medication #2:  RAMIPRIL 10 MG CAPS take 2 tabs once daily  Medication #3:  LIPITOR 80 MG TABS take one tab at bedtime  Medication #4:  POTASSIUM CHLORIDE CRYS CR 20 MEQ CR-TABS 3 daily Initial call taken by: Kern Reap CMA Duncan Dull),  April 02, 2010 5:16 PM    Prescriptions: ZETIA 10 MG TABS (EZETIMIBE) take one tab once daily  #100 x 3   Entered by:   Kern Reap CMA (AAMA)   Authorized by:   Roderick Pee MD   Signed by:   Kern Reap CMA (AAMA) on 04/02/2010   Method used:   Electronically to        [facility] ([facility])       [facility]       [address]       [place], [place]  [address]       Ph: [phone]       Fax: [phone]   RxID:   [id number] LIPITOR 80 MG TABS (ATORVASTATIN CALCIUM) take one tab at bedtime  #100 x 3   Entered by:   Kern Reap CMA (AAMA)   Authorized by:   Roderick Pee MD   Signed by:   Kern Reap CMA (AAMA) on 04/02/2010   Method used:   Electronically to        [facility] ([facility]order)       [facility]       [address]       [place], [place]  [address]       Ph: [phone]       Fax: [phone]   RxID:   [id number] POTASSIUM CHLORIDE CRYS CR 20 MEQ CR-TABS (POTASSIUM CHLORIDE CRYS CR) 3 daily  #300 x 3   Entered by:   Kern Reap CMA (AAMA)   Authorized by:   Roderick Pee MD   Signed by:   Kern Reap CMA (AAMA) on 04/02/2010   Method used:   Electronically to        [facility] ([facility])       [facility]       [address]       [place], [place]  [address]       Ph: [phone]       Fax: [phone]   RxID:   [id number] RAMIPRIL 10 MG CAPS (RAMIPRIL) take 2 tabs once daily  #180 x 3   Entered by:   Kern Reap CMA (AAMA)   Authorized by:   Roderick Pee MD   Signed by:   Kern Reap CMA (AAMA) on 04/02/2010   Method used:   Electronically to        [facility] ([facility]order)       [facility]       [address]       [place], [place]  [address]       Ph: [phone]       Fax: [phone]   RxID:   [id number]

## 2011-01-16 NOTE — Progress Notes (Signed)
Summary: aricept  Phone Note Refill Request   Refills Requested: Medication #1:  ARICEPT 10 MG TABS Take 1 tablet by mouth every night. Initial call taken by: Kern Reap CMA Duncan Dull),  Apr 15, 2010 11:48 AM    Prescriptions: ARICEPT 5 MG TABS (DONEPEZIL HCL) 1 tab @ bedtime  #100 x 3   Entered by:   Kern Reap CMA (AAMA)   Authorized by:   Roderick Pee MD   Signed by:   Kern Reap CMA (AAMA) on 04/15/2010   Method used:   Electronically to        [facility] ([facility])       [facility]       [address]       [place], [place]  [address]       Ph: [phone]       Fax: [phone]   RxID:   [id number]

## 2011-02-12 ENCOUNTER — Telehealth: Payer: Self-pay | Admitting: *Deleted

## 2011-02-12 NOTE — Telephone Encounter (Signed)
patient  Would like a refill of nabumetone 500 is this okay to fill?

## 2011-02-13 NOTE — Telephone Encounter (Signed)
OTC Motrin, 600 mg b.i.d., works just as well, and cheaper

## 2011-02-13 NOTE — Telephone Encounter (Signed)
Pharmacy notified.

## 2011-02-17 ENCOUNTER — Telehealth: Payer: Self-pay | Admitting: Cardiology

## 2011-02-25 NOTE — Progress Notes (Signed)
Summary: Lipitor refill  Phone Note Refill Request Message from:  Patient on February 17, 2011 11:01 AM  Refills Requested: Medication #1:  LIPITOR 80 MG TABS take one tab at bedtime   Dosage confirmed as above?Dosage Confirmed   Notes: [facility] Initial call taken by: Cloyde Reams RN,  February 17, 2011 11:02 AM     Appended Document: Lipitor refill

## 2011-02-28 ENCOUNTER — Telehealth: Payer: Self-pay | Admitting: Family Medicine

## 2011-02-28 NOTE — Telephone Encounter (Signed)
Pts spouse called and said that they rcvd a call and letter from [facility] re: 2 pt scripts, Nabumetone 500 mg and Zetia 10 mg. Need to get refills for both of these meds for 90 day supply with 1 year refills on both.

## 2011-03-03 ENCOUNTER — Other Ambulatory Visit: Payer: Self-pay | Admitting: Family Medicine

## 2011-03-03 MED ORDER — EZETIMIBE 10 MG PO TABS: 10.0000 mg | ORAL_TABLET | Freq: Every day | ORAL | Status: DC

## 2011-03-03 NOTE — Telephone Encounter (Signed)
rx sent

## 2011-03-03 NOTE — Telephone Encounter (Signed)
Ok.......... when his next CPX ???????

## 2011-04-19 ENCOUNTER — Other Ambulatory Visit: Payer: Self-pay | Admitting: Family Medicine

## 2011-04-29 NOTE — Assessment & Plan Note (Signed)
[facility]                            CARDIOLOGY OFFICE NOTE   NAME:JONESMessina, Kosinski                          MRN:          [id number]  DATE:04/27/2007                            DOB:          1928-02-13    The patient is a very pleasant, 77 year old, obese, white, married  female with hypertension, hyperlipidemia and nonobstructive coronary  disease.   She has no cardiac symptoms.  She does have severe arthritis.  Most  recent stress test in 2004, revealed no scar or ischemia.  Abdominal  ultrasound on October 20, 2002, revealed no abdominal aneurysm.   Her major problems have been that of hypertension, obesity and  hyperlipidemia.  A catheterization in 1992, did reveal a 20% lesion in  the LAD.   The patient is being followed in the lipid clinic.  Recent LDL was 87  with HDL of 49.  Glucose slightly elevated at 116.  Alk phos was 121.   MEDICATIONS:  1. Altace 10 mg b.i.d.  2. Potassium 20 mEq b.i.d.  3. Zetia 10 mg.  4. Hydrochlorothiazide 25.  5. Lipitor 20  6. Aspirin 81.   ALLERGIES:  The patient has not tolerated NORVASC in the past.   PHYSICAL EXAMINATION:  VITAL SIGNS:  Blood pressure 170/100.  Repeat by  me ws 145/82 bilaterally.  GENERAL:  Obese.  NECK:  JVP not elevated.  Carotid pulses palpable and equal without  bruits.  LUNGS:  Clear.  CARDIAC:  Normal with no murmur or gallop.  ABDOMEN:  Unremarkable.  EXTREMITIES:  Trace edema.   IMPRESSION:  1. Hypertension, borderline.  The patient states that her pressures      are routinely in the 120s/70 at home.  2. Hyperlipidemia on therapy, however, we plan to discontinue the      Zetia and increase the Lipitor and recheck this in 6 weeks.  3. Obesity.  4. Severe arthritis.   LABORATORY DATA AND X-RAY FINDINGS:  EKG reveals low voltage QRS,  otherwise unremarkable.   ASSESSMENT/PLAN:  I have suggested changes as above.  She will follow up  in the lipid clinic in 6 months and  see Dr. Daleen Squibb in 4 months for  continued cardiology followup.     Cecil Cranker, MD, [facility]  Electronically Signed    EJL/MedQ  DD: 04/27/2007  DT: 04/27/2007  Job #: [id number]

## 2011-04-29 NOTE — Assessment & Plan Note (Signed)
[facility]                               LIPID CLINIC NOTE   NAME:[name], [name]                          MRN:          [id number]  DATE:04/19/2007                            DOB:          01/18/1928    The patient was seen at the Lipid Clinic for further evaluation and  medication titration __________ with her hyperlipidemia in the setting  of high risk features.  The patient has been feeling and doing really  well. She has had no muscle aches or pains, weakness, tingling or other  problems.  She has cut her whole milk intake down and is now only using  2% milk on a daily basis.  She has had continued to exercise with her  stationary bicycle, riding 1 to 1-1/2 miles daily.  She has been  compliant with her lipid lower drug therapy and has been tolerating it  without muscle aches, pains, weakness, tingling or other problems.   PAST MEDICAL HISTORY:  Is pertinent for coronary disease.   CURRENT MEDICATIONS:  Include:  1. Zetia 10 mg daily.  2. Lipitor 20 mg daily.  3. Aspirin 81 mg daily.  4. Multivitamin daily.  5. Hydrochlorothiazide 25 mg daily.  6. K-Dur 40 mEq daily.  7  Calcium with vitamin D daily.  1. Altace 20 mg daily.   REVIEW OF SYSTEMS:  As stated in the HPI, otherwise negative.   PHYSICAL EXAMINATION:  Weight today is 220 pounds, blood pressure is  130/82, heart rate is 72.  Respirations are 17.   Labs on April 30 reveal a total cholesterol 164, triglyceride 138, HDL  49, LDL 87 and LFTs are within normal limits.  Alkaline phosphatase is  121.   ASSESSMENT:  The patient has been feeling and doing well overall.  She  has had no muscle aches, pains, weakness, tingling or other problems.  States she __________ cholesterol medication complications.  We have  provided the patient with samples and will follow up with  her in 6 months.  She will continue to work on her __________ diet and  exercise modifications.  She will call with  questions or problems in the  meantime.      Shelby Dubin, PharmD, BCPS, CPP       Rollene Rotunda, MD, [facility]    MP/MedQ  DD: 04/19/2007  DT: 04/19/2007  Job #: [id number]   cc:   Cecil Cranker, MD, [facility]

## 2011-04-29 NOTE — Assessment & Plan Note (Signed)
[facility]                               LIPID CLINIC NOTE   NAME:JONESLawren, Sexson                          MRN:          [id number]  DATE:10/21/2007                            DOB:          1928-11-27    Ms. Sawhney comes in today for follow up of her hyperlipidemia therapy,  which includes Zetia 10 mg daily and Lipitor 40 mg daily.  She has been  compliant with these medications, and is tolerating them fine with no  muscle aches or pains to report.   Other medications include Altace, potassium, hydrochlorothiazide,  multivitamin, calcium with vitamin D, nabumetone, and aspirin.   PHYSICAL EXAMINATION:  Reveals a weight of 229 pounds.  Blood pressure  135/60.  Heart rate is 90.   LABORATORY DATA:  Includes total cholesterol of 148, triglycerides 98,  HDL 55.3, LDL 73.  Liver function tests are within normal limits.   ASSESSMENT:  Ms. Wigglesworth' lipid profile is at goal, except for her LDL,  which is just above the goal of less than 70.  Due to her severe  osteoarthritis in both of her knees, she walks with a cane, and has  trouble with any sort of exercising.  She continues to try to follow a  heart-healthy diet along with her husband, who is also on cholesterol  medicine.  They try to eat more chicken, reduce fried foods.  She does  not care for seafood, so she mostly eats a lot of chicken and [place].   PLAN:  We are going to continue with the same medications.  I encouraged  her to continue to improve her diet, and asked her to exercise as much  as possible, suggesting doing some arm presses up above her head to get  her heart rate going for at least 20 to 30 minutes.  We are going to  follow up with her in 6 months with repeat lipid and liver panel, and  changes or therapy if they are indicated.  She was encouraged to call  with any questions or problems in the meantime.      Charolotte Eke, PharmD  Electronically Signed      Rollene Rotunda,  MD, [facility]  Electronically Signed   TP/MedQ  DD: 10/21/2007  DT: 10/21/2007  Job #: [id number]

## 2011-04-29 NOTE — Assessment & Plan Note (Signed)
[facility]                            CARDIOLOGY OFFICE NOTE   NAME:JONESKajah, Santizo                          MRN:          [id number]  DATE:09/02/2007                            DOB:          14-Jul-1928    Ms. Gohlke returns today to establish with me as her cardiologist.  She  has been a former patient of Dr. Cecil Cranker.   PROBLEM LIST:  1. Nonobstructive coronary disease.  She had a 20% stenosis in the LAD      by cath in 1992.  2. Hypertension.  3. Obesity.  4. Hyperlipidemia.   She has no complaints today.  She gets around very little with a cane  because of severe arthritis in both knees.  She has no orthopnea, PND,  or peripheral edema.  She has had no chest pain, tachy palpitations,  presyncope, or syncope.   Dr. Corinda Gubler stopped her Zetia on last visit and increased her Lipitor  from 20 to 40 mg.  Her lipids showed deterioration with her LDL  increasing from 87 to 119.  She had done well through the lipid clinic  for years on Zetia and Lipitor according to her.   MEDICATIONS:  1. Altace 10 mg p.o. b.i.d.  2. Potassium 20 mEq b.i.d.  3. HCTZ 25 mg a day.  4. Multivitamin daily.  5. Calcium and vitamin D daily.  6. Lipitor 40 mg a day.  7. Nabumetone 1 p.o. b.i.d.  8. Aspirin 81 mg a day.   PHYSICAL EXAMINATION:  VITAL SIGNS:  Her blood pressure is 132/72.  Her  pulse is 60 and regular.  EKG shows a sinus rhythm with low voltage.  There has been no significant change.  HEENT:  Normocephalic atraumatic.  PERRLA.  Sclerae are clear.  Her eyes  are circumferentially swollen which is chronic from allergies.  Her  cheeks are ruddy.  Facial symmetry is normal.  NECK:  Carotids upstrokes were equal bilaterally without bruits.  There  is no JVD.  Neck is supple.  Thyroid is not enlarged.  Trachea is  midline.  LUNGS:  Clear.  HEART:  Reveals a poorly appreciated PMI.  She has a soft S1 S2.  ABDOMEN:  Soft.  Good bowel sounds.  Obesity  precludes detection of any  organomegaly.  EXTREMITIES:  Reveal 1 plus edema.  Pulses are intact.  NEUROLOGIC:  Grossly intact.  She has very swollen chronic arthritic  knees.  SKIN:  Shows a few ecchymoses.   ASSESSMENT/PLAN:  Ms. Sarnowski is stable from a cardiovascular standpoint.  I am not happy with her deterioration in lipid status.  We have added  Zetia 10 mg daily back to her Lipitor.  I have told her further data  will be out in the next couple of years but right now our goal is to get  her LDL well below 100.  She agrees with this plan.  We will check  lipids and LFTs in about 6-8 weeks.  I will see her back in 6 months.     Thomas C. Wall, MD,  [facility]  Electronically Signed    TCW/MedQ  DD: 09/02/2007  DT: 09/02/2007  Job #: [id number]

## 2011-04-29 NOTE — Assessment & Plan Note (Signed)
[facility]                               LIPID CLINIC NOTE   NAME:JONESDiasia, Henken                        MRN:          [id number]  DATE:12/21/2008                            DOB:          01-12-1928    Ms. Woloszyn was seen in the Lipid Clinic for further evaluation and  medication titration associated with her hyperlipidemia in the setting  of documented coronary disease equivalents.  She is currently taking  Lipitor 40 mg daily and Zetia 10 mg daily.  She has been compliant with  these therapies and is tolerating them well.  She states that she eats  cereal with skim milk each morning for breakfast, sandwiches include  white bread for lunch.  She her and husband eat out most days for  supper.  She is unable exercise due to her chronic leg weakness.  Her  daughter has discouraged her from very much exertion per her report  today.   REVIEW OF SYSTEMS:  As stated in the HPI.   CURRENT MEDICATIONS:  Updated on her chart.   PHYSICAL EXAMINATION:  VITAL SIGNS:  Weight today is 233 pounds, blood  pressure is 130/78, and respirations are 16.   Labs on December 18, 2008, revealed total cholesterol 159, triglyceride  105, HDL 54.7, LDL 83, non-HDL is 104.3.  LFTs are within normal limits.   ASSESSMENT:  The patient is at goal other than her LDL cholesterol,  which is slightly elevated at 83, for her target of 70 or below.  She is  tolerating her medications well, does not have any issues with cost.  She has been able to switch artificial sweetener since her last visit.  The patient will continue her current medicines except we will increase  her to Lipitor 80 mg daily and send that prescription to [facility]  She has not been able to walk around at the mall due to pain  in her legs but has been able to switch to artificial sweeteners and has  lowered her sugar intake.  She will work after increasing her Lipitor to  80 mg daily, to change her white  bread over to wheat.  Increase natural  fibers through foods and continue a low-sugar, low-fat  diet.  She will work on increasing her physical activity and health.  She will follow up in 8 weeks on February 22, 2009, at 9:30.     Shelby Dubin, PharmD, BCPS, CPP  Electronically Signed      Madolyn Frieze. Jens Som, MD, [facility]  Electronically Signed   MP/MedQ  DD: 12/22/2008  DT: 12/23/2008  Job #: [id number]   cc:   Madolyn Frieze. Jens Som, MD, [facility]

## 2011-04-29 NOTE — Assessment & Plan Note (Signed)
[facility]                               LIPID CLINIC NOTE   NAME:JONESJabrea, [name]                        MRN:          [id number]  DATE:04/06/2008                            DOB:          1928-01-11    [name] comes in today for followup of her hyperlipidemia therapy  which includes Lipitor 40 mg daily and Zetia 10 mg daily.  She has been  compliant with these therapies, and is tolerating them just fine with no  muscle aches or pains.  Other medications have not changed.  They  include potassium, hydrochlorothiazide, multivitamin, calcium with  vitamin D, nabumetone, aspirin 81 mg, and Altace.   PHYSICAL EXAMINATION:  Her weight is 232 pounds, this is up by 3 pounds  from her last visit.  Blood pressure is 120/60, heart rate is 60.   LABORATORY DATA:  Includes total cholesterol 162, triglycerides 131, HDL  55.6, LDL 80, liver function tests were within normal limits, glucose  was 105.   ASSESSMENT:  [name] is doing well on her current therapy.  Her  triglycerides and HDL are at goal, but her LDL is up slightly, and it is  not at the goal of less than 70.  The patient thinks this is primarily  due to increased intake of desserts in the past few months.  Besides  this increase in desserts, she continues to have sensible breakfast of  Honey Nut Cheerios and 2% milk and orange juice.  For lunch she does use  white bread, has a sandwich, primarily for lunches, and sweet tea.  For  dinner she usually eats out, lots of chicken, avoids fried foods.  Does  not each [place] fries.  Her exercise is limited due to leg pain.  She  does housework, though, and she does walk around the house which does  get her heart rate up.   PLAN:  We are not going to change either of her cholesterol medicines at  this time.  We counseled her on increasing the amount of fiber and whole  wheats that she eats, and decreasing the amount of refined sugar,  primarily sweet tea,  in her diet.  We also counseled her on the  importance of portion size.  She is going to increase her exercise by  walking more around the house.  Her mobility remains a barrier to  exercise, though.  She has gained weight from her last visit with [place] in  November of last year.  We asked her to try and increase her activity  with the primary goal being losing 5 pounds in the next 3 months.  We  are going to follow up with her in 3 months with repeat liver and lipid  panel, and we may have to increase the  Lipitor to a maximum dose if her LDL is still not at goal.  This patient  was seen along with Lara Mulch, PharmD candidate.      Charolotte Eke, PharmD  Electronically Signed      Rollene Rotunda, MD, [facility]  Electronically Signed  TP/MedQ  DD: 04/06/2008  DT: 04/06/2008  Job #: [id number]

## 2011-04-29 NOTE — Assessment & Plan Note (Signed)
[facility]                            CARDIOLOGY OFFICE NOTE   NAME:[name], [name]                        MRN:          [id number]  DATE:10/23/2008                            DOB:          03-23-1928    [name] returns today for followup.   PROBLEM LIST:  1. Nonobstructive coronary disease.  She is having no angina or      ischemic symptoms.  2. Hyperlipidemia.  Her numbers were remarkably good, July 2009 on      Lipitor 40.  3. Hypertension.  She says her blood pressures usually under very good      control.   MEDICATIONS:  1. Potassium 20 mEq b.i.d.  2. Hydrochlorothiazide 25 mg a day.  3. Multivitamin daily.  4. Calcium and D.  5. Lipitor 40 mg daily.  6. Nabumetone 500 mg p.o. b.i.d.  7. Aspirin enteric-coated 81 mg a day.  8. Zetia 10 mg a day.  9. Altace 10 mg b.i.d.   PHYSICAL EXAMINATION:  VITAL SIGNS:  Her blood pressure today was up at  160/90.  She said it was low yesterday around 120, pulse was 68 and  regular, her weight was 237, up 7.  HEENT:  Normal.  NECK:  Carotid upstrokes were equal bilaterally without bruits.  No JVD.  Thyroid is not enlarged.  Trachea is midline.  LUNGS:  Clear.  HEART:  Regular rate and rhythm.  PMI was hard to appreciate.  ABDOMEN:  Soft, good bowel sounds.  EXTREMITIES:  Trace edema.  Pulses are intact.  Both dorsalis pedis and  posterior tibial.  She has some mild amount of dependent rubor at the  tips of her feet and toes.  NEUROLOGIC:  Intact.  SKIN:  A few ecchymoses.   EKG shows sinus rhythm with low voltage.  There has been no change.   [name] is doing well.  I have made no changes in her medical program.  We will see her back in July at which time she will need follow up  lipids.     Thomas C. Daleen Squibb, MD, [facility]  Electronically Signed   TCW/MedQ  DD: 10/23/2008  DT: 10/23/2008  Job #: [id number]

## 2011-04-29 NOTE — Assessment & Plan Note (Signed)
[facility]                            CARDIOLOGY OFFICE NOTE   NAME:JONESMiah, Boye                        MRN:          [id number]  DATE:02/18/2008                            DOB:          18-Feb-1928    Ms. [name] comes in today for further manage of following issues.  1. Nonobstructive coronary disease.  She is having no angina.  2. Hyperlipidemia.  I was not happy with her lipids on her last visit      and we added Zetia 10 mg a day to her Lipitor or 40 mg a day.      Followup lipids still showed her LDL to be 95.  I have emphasized      compliance but she seems to do so.  Will check them again today      since she is fasting.   She has no orthopnea, PND or peripheral edema.  She has a birthday next  week!   Her meds are potassium 20 mg b.i.d., hydrochlorothiazide 25 mg a day,  multivitamin daily, calcium with vitamin D daily, Lipitor 40 mg a day,  nabumetone 1 p.o. b.i.d., enteric-coated aspirin 81 mg a day, Zetia 10  mg a day, and Altace 20 mg a day.   Her blood pressures are usually quite good at home running about 120-  130.  She happens to be high today at a 152/74, pulse 75 and regular.  Weight stable at 230.  She is in no acute distress.  HEENT:  Shows significant puffiness around her eyes from allergies.  PERRL.  Extraocular movements intact.  Sclerae are clear, nonicteric.  Facial symmetry is normal.  Carotid upstrokes are equal bilaterally  without bruits, no JVD.  Thyroid is not enlarged.  Trachea is midline.  LUNGS:  Clear.  HEART:  Reveals a poorly appreciated PMI.  She has soft S1-S2.  ABDOMEN:  Soft, good bowel sounds.  Organomegaly could not be adequately  assessed.  EXTREMITIES:  Reveal trace edema.  Pulses are intact.  NEURO:  Exam is intact.   [name] is doing well.  She assures me her blood pressure is under  good control.  Will check lipids today as well as a comprehensive  metabolic panel.  Hopefully, her LDL will be  more in the 70 range.   I will see her back again in November 2009.     Thomas C. Daleen Squibb, MD, [facility]  Electronically Signed    TCW/MedQ  DD: 02/18/2008  DT: 02/18/2008  Job #: [id number]

## 2011-04-29 NOTE — Assessment & Plan Note (Signed)
[facility]                               LIPID CLINIC NOTE   NAME:JONESKymber, [name]                        MRN:          [id number]  DATE:02/22/2009                            DOB:          10-05-1928    The patient is seen in the Lipid Clinic for further evaluation and  medication titration associated with her hyperlipidemia.  She has been  feeling and doing well on her Lipitor 80 mg daily and her Zetia 10 mg  daily.  She continues to eat lean meats that are baked or  grilled, 1  serving of vegetables per meal.  She is not snacking on a regular basis.  She continues to avoid exercising due to her knee pain.  She is a  compliant patient.  She patient is tolerating her medications well.   PAST MEDICAL HISTORY:  Pertinent for hyperlipidemia.   PHYSICAL EXAMINATION:  Weight is 233 pounds, blood pressure is 134/68,  and heart rate 60.   LABORATORY DATA:  On February 19, 2009, revealed total cholesterol 141,  triglycerides 118, HDL 47.8, LDL 70.  LFTs are within normal limits  excepting an alkaline phosphatase of 118 and a T bili of 1.5.   ASSESSMENT:  The patient has lipid panel that looks great after  increasing her Lipitor 80 mg daily.  She continues to eat balanced meals  and she is limiting the amount of exercise she gets.  She is not  interested in water exercises at this time.  She will continue on this  therapy for now.  She will continue to work to increase her whole grains  and consider water exercises.  She will follow up on August 23, 2009,  for followup.   I appreciate the opportunity to see this pleasant patient.      Shelby Dubin, PharmD, BCPS, CPP  Electronically Signed      Rollene Rotunda, MD, [facility]  Electronically Signed   MP/MedQ  DD: 03/12/2009  DT: 03/13/2009  Job #: [id number]   cc:   Madolyn Frieze. Jens Som, MD, [facility]

## 2011-04-29 NOTE — Assessment & Plan Note (Signed)
[facility]                               LIPID CLINIC NOTE   NAME:[name], [name]                        MRN:          [id number]  DATE:12/21/2008                            DOB:          Dec 09, 1928    ADDENDUM   The patient will continue her current medicines except we will increase  her to Lipitor 80 mg daily and [facility] that [facility] to [facility].  She has not been able to walk around at the mall due to pain  in her legs but has been able to switch to artificial sweeteners and has  lowered her sugar intake.  She will work after increasing her Lipitor to  80 mg daily, to change her white bread over to wheat.  Increase natural  fibers through foods and continue a low-sugar, low-fat diet.  She will  work on increasing her physical activity and health.  She will follow up  in 8 weeks on February 22, 2009, at 9:30.      Shelby Dubin, PharmD, BCPS, CPP       Madolyn Frieze. Jens Som, MD, [facility]    MP/MedQ  DD: 12/22/2008  DT: 12/23/2008  Job #: [id number]

## 2011-04-29 NOTE — Assessment & Plan Note (Signed)
[facility]                               LIPID CLINIC NOTE   NAME:[name], [name]                        MRN:          [id number]  DATE:06/29/2008                            DOB:          [birth year]    The patient is seen in the Lipid Clinic for further evaluation and  medication titration associated with her hyperlipidemia in the setting  of documented coronary artery disease.  She has been compliant with her  Lipitor 40 mg and Zetia 10 mg each day.  She has tolerated this without  issues.  She has been working to decrease her portions in her meals  though many meals are eaten out.  She does try to substitute sweeteners  in tea.  She continues to walk on each day at the mall with her daughter  though the pace of this is not as fast as it might be hoped.  She has  had a weight gain of 5 pounds since her last visit and this is the topic  of discussion today.   PAST MEDICAL HISTORY:  Pertinent for hyperlipidemia and documented  coronary artery disease.   CURRENT MEDICATIONS:  1. Ramipril 10 mg twice daily.  2. Potassium 20 mEq twice daily that she is having problem swallowing.  3. Hydrochlorothiazide 25 mg daily.  4. Lipitor 40 mg daily.  5. Nabumetone 500 mg twice daily.  6. Zetia 10 mg daily.  7. Enteric-aspirin 81 daily.  8. Centrum Silver with Lycopene daily.  9. Calcium carbonate with vitamin D daily.   REVIEW OF SYSTEMS:  As stated in the HPI, otherwise negative.   PHYSICAL EXAMINATION:  VITAL SIGNS:  Weight today 237 pounds, up 5  pounds since her last visit, blood pressure is 130/70, and heart rate is  50.   LABORATORY DATA:  Labs from June 23, 2008, revealed total cholesterol of  152, triglycerides 92, HDL 56.5, and LDL is 77.  LFTs within normal  limits.  Alk phos is 128, which is not out of range for this patient.   ASSESSMENT:  The patient is progressing on her current regimen.  Her  increased weight is noted.  She will plan to  continue on her current  medications.  She will work to reduce her fats and reduce her sugar  intake through her tea intake.  She will continue walks to the mall with  her daughter and work on increasing duration and see if it works.  She  will follow up with [place] on December 21, 2017, at 1:30 with labs on the 4th.  Prior to that visit, she will call with questions or  problems.  In the meantime, she will also consider if she would like to  change over to a liquid potassium supplement to lessen her difficulty in  tolerating large tablets.      Shelby Dubin, PharmD, BCPS, CPP  Electronically Signed      [name], MD, [facility]  Electronically Signed   MP/MedQ  DD: 06/29/2008  DT: 06/30/2008  Job #: [id number]   cc:  Wallis Bamberg. Johnsie Cancel, MD, [facility]

## 2011-05-01 ENCOUNTER — Telehealth: Payer: Self-pay | Admitting: Family Medicine

## 2011-05-01 MED ORDER — NABUMETONE 500 MG PO TABS: 500.0000 mg | ORAL_TABLET | Freq: Every day | ORAL | Status: DC

## 2011-05-01 NOTE — Telephone Encounter (Signed)
Please send a new rx for Nabumetone to be sent to [facility].. [facility]

## 2011-05-02 NOTE — Assessment & Plan Note (Signed)
[facility]                                 LIPID CLINIC NOTE   NAME:[name], [name]                          MRN:          [id number]  DATE:10/22/2006                            DOB:          October 13, 1928    Ms. [name] comes in today with her husband, doing okay. Tolerating her  current cholesterol medicines which include Lipitor 20 mg daily and Zetia 10  mg daily. She denies any new muscle aches and pains or any other problem.  Her other medications have not changed, they include diltiazem, baby  aspirin, multivitamin, hydrochlorothiazide, potassium, calcium with vitamin  D and Altace.   PHYSICAL EXAMINATION:  VITAL SIGNS: Weight of 227 pounds, blood pressure is  136/81, heart rate 63.   LABORATORY DATA:  Includes total cholesterol 165, triglycerides 112, HDL  53.6, LDL 89. Liver function tests are within normal limits. Fasting glucose  was 110.   ASSESSMENT:  Ms. [name]' triglycerides, HDL and LDL are all at goal. She and  her husband have continued to maintain a heart healthy diet as much as  possible. She has been riding a stationary bicycle every day for 1 to 1.5  miles.   PLAN:  Continue with her current medications. Follow up with [place] in 6 months.  I encouraged her to continue to make diet improvements and to continue with  her current exercise program.      Charolotte Eke, PharmD  Electronically Signed      Doylene Canning. Ladona Ridgel, MD  Electronically Signed   TP/MedQ  DD: 10/22/2006  DT: 10/22/2006  Job #: [id number]

## 2011-05-02 NOTE — Assessment & Plan Note (Signed)
[facility]                            CARDIOLOGY OFFICE NOTE   NAME:[name], Tagliaferro                          MRN:          [id number]  DATE:01/25/2007                            DOB:          [birth year]    A very pleasant 77 year old white married female with hypertension,  hyperlipidemia, obesity, and nonobstructive coronary disease.   Since her last visit when her blood pressure was 154/82, we tried  Norvasc but she did not tolerate it.  Subsequently increased her Altace  to 20 and her blood pressure has been quite well controlled.  She is  having no cardiac symptoms.  The patient is on Zetia 10, Lipitor 20,  aspirin, hydrochlorothiazide 25, K-Dur 40, Altace 20.   PHYSICAL EXAMINATION:  Blood pressure 130/73, pulse 66, normal sinus  rhythm.  General appearance normal, JVP is not elevated.  Carotid pulses  bilaterally without bruit.  Lungs clear.  Cardiac exam is normal.  Abdominal exam normal.  Extremities reveal 1+ edema.   IMPRESSION:  1. Hypertension, presently controlled.  2. Hyperlipidemia, on therapy.  3. Obesity.  4. Severe osteoarthritis.   The patient appears to be stable at this time.  We plan to continue on  her same therapy.  Her EKG reveals normal sinus rhythm, poor R-wave  progression in the precordial leads.  We will see her back in 3 months  and then I will have her see Dr. Daleen Squibb for followup.     Cecil Cranker, MD, [facility]  Electronically Signed    EJL/MedQ  DD: 01/25/2007  DT: 01/25/2007  Job #: [id number]

## 2011-05-02 NOTE — Assessment & Plan Note (Signed)
[facility]                              CARDIOLOGY OFFICE NOTE   NAME:JONESCaralyn, Twining                          MRN:          [id number]  DATE:09/30/2006                            DOB:          Feb 24, 1928    Ms. Gilmer is a very pleasant 77 year old married white female with  nonobstructive coronary artery disease, hypertension, hyperlipidemia, and  obesity.  She was seen on April 07, 2006.  She had gained 17 pounds over a  period of six months which she attributed to the fact that she had not  walked related to her osteoarthritis of her knees.   Subsequently she has lost three pounds.  She has improved somewhat with  physical therapy and is using a bicycle.  She has not had surgery as yet.  She also history of uterine cancer for which she is followed at [facility].  She  has no cardiac symptoms at this time.   MEDICATIONS:  1. Diltiazem 240.  2. Zetia 10.  3. Lipitor 20.  4. Baby aspirin.  5. Hydrochlorothiazide 25.  6. K-Dur 40 daily.  7. Altace 10.   PHYSICAL EXAMINATION:  GENERAL APPEARANCE:  Obese, no distress.  VITAL SIGNS:  Blood pressure 154/82, pulse 52 normal sinus rhythm.  NECK:  JVP is not elevated.  Carotid pulses palpable and equal without  bruits.  LUNGS:  Clear.  CARDIOVASCULAR:  Normal.  EXTREMITIES:  There is 2+ edema.   EKG reveals normal sinus rhythm, low QRS, otherwise unremarkable.   IMPRESSION:  1. Hypertension, poorly controlled.  2. Obesity.  3. Hyperlipidemia.  4. Severe osteoarthritis.   I have suggested increase in the Altace to 20 mg daily.  We plan to check  BMP today.  I will see her back in a month to check lipids and LFTs at that  time.  She is also getting a flu shot today.            ______________________________  E. Graceann Congress, MD, [facility]   EJL/MedQ DD:  09/30/2006 DT:  10/01/2006 Job #:  [id number]

## 2011-05-02 NOTE — Assessment & Plan Note (Signed)
[facility]                              CARDIOLOGY OFFICE NOTE   NAME:JONESKadynce, Bonds                          MRN:          [id number]  DATE:10/27/2006                            DOB:          08/20/1928    A very pleasant 77 year old white female with nonobstructive coronary  disease, hypertension, hyperlipidemia and obesity.  She was most recently  seen on September 30, 2006, at which time her blood pressure was 154/82.  We  increased her Altace to 10.  She returns now and states her blood pressures  at home were 135-140/70s; however, today her blood pressure was 160/75,  pulse of 65.  Cardiorespiratory exam was unremarkable.   DIAGNOSIS:  As above.   I have reviewed the patient with Dr. Jens Som.  We both feel that it would  be best to discontinue Diltiazem, add Norvasc 10 and see her back in a few  weeks.  If her pressure is not better controlled, we might then add a beta  blocker.     Cecil Cranker, MD, [facility]  Electronically Signed    EJL/MedQ  DD: 10/27/2006  DT: 10/27/2006  Job #: [id number]

## 2011-05-09 ENCOUNTER — Telehealth: Payer: Self-pay | Admitting: Cardiology

## 2011-05-09 MED ORDER — HYDROCHLOROTHIAZIDE 25 MG PO TABS: 25.0000 mg | ORAL_TABLET | Freq: Every day | ORAL | Status: DC

## 2011-05-09 NOTE — Telephone Encounter (Signed)
Refill H[facility]z, [facility]

## 2011-05-09 NOTE — Telephone Encounter (Signed)
RX sent into pharmacy. Pt notified. 

## 2011-06-17 ENCOUNTER — Other Ambulatory Visit: Payer: Self-pay | Admitting: Family Medicine

## 2011-06-17 MED ORDER — NABUMETONE 500 MG PO TABS: 500.0000 mg | ORAL_TABLET | Freq: Every day | ORAL | Status: DC

## 2011-06-17 NOTE — Telephone Encounter (Signed)
Okay to refill medication, dispense 100 tabs directions one daily refills x 3

## 2011-06-17 NOTE — Telephone Encounter (Signed)
Pts spouse came by office to req to refill of Nabumetone 500 mg Tab Take 1 tab by mouth daily to [facility]. Pts spouse said that if Dr Tawanna Cooler had any questions, to contact Dr Sedonia Small at [facility].

## 2011-06-24 ENCOUNTER — Encounter: Payer: Self-pay | Admitting: Cardiology

## 2011-07-31 ENCOUNTER — Encounter: Payer: Self-pay | Admitting: Cardiology

## 2011-07-31 ENCOUNTER — Ambulatory Visit (INDEPENDENT_AMBULATORY_CARE_PROVIDER_SITE_OTHER): Payer: Medicare Other | Admitting: Cardiology

## 2011-07-31 VITALS — BP 126/78 | HR 64 | Ht 66.0 in

## 2011-07-31 DIAGNOSIS — I2510 Atherosclerotic heart disease of native coronary artery without angina pectoris: Secondary | ICD-10-CM | POA: Insufficient documentation

## 2011-07-31 DIAGNOSIS — E785 Hyperlipidemia, unspecified: Secondary | ICD-10-CM

## 2011-07-31 MED ORDER — NITROGLYCERIN 0.4 MG SL SUBL: 0.4000 mg | SUBLINGUAL_TABLET | SUBLINGUAL | Status: DC | PRN

## 2011-07-31 NOTE — Assessment & Plan Note (Signed)
Stable. No change in treatment. Acute  Coronary syndrome reviewed.  Nitroglycerin renewed.

## 2011-07-31 NOTE — Progress Notes (Signed)
HPI [name] comes the office today for evaluation and management of coronary artery disease as well as hyperlipidemia.  He is pretty much stationary most today. She can get up to go the bathroom. She's having no angina or ischemic symptoms. She seems to be compliant with her meds.  He is on HCTZ but is reluctant to take a stronger diuretic. She does have dependent edema. She denies orthopnea PND.  EKG today shows normal sinus rhythm, low voltage, and poor R-wave progression in the anterior precordium.  Lipids reviewed and are at goal.   Past Medical History  Diagnosis Date  . CAD (coronary artery disease)     nonobstructive; unspecified site  . Hyperlipidemia   . HTN (hypertension)   . Vitamin B12 deficiency   . OA (osteoarthritis)   . Memory loss     No past surgical history on file.  No family history on file.  History   Social History  . Marital Status: Married    Spouse Name: N/A    Number of Children: N/A  . Years of Education: N/A   Occupational History  . Retired    Social History Main Topics  . Smoking status: Never Smoker   . Smokeless tobacco: Not on file  . Alcohol Use: No  . Drug Use: No  . Sexually Active: Not on file   Other Topics Concern  . Not on file   Social History Narrative  . No narrative on file    No Known Allergies  Current Outpatient Prescriptions  Medication Sig Dispense Refill  . aspirin (ADULT ASPIRIN EC LOW STRENGTH) 81 MG EC tablet Take 81 mg by mouth daily.        Marland Kitchen atorvastatin (LIPITOR) 80 MG tablet Take 80 mg by mouth at bedtime.        . Calcium-Vitamin D 600-125 MG-UNIT TABS Take 1 tablet by mouth daily.        . cyanocobalamin (,VITAMIN B-12,) 1000 MCG/ML injection Inject into the muscle. 1 cc weekly for two months, then 1 cc monthly       . ezetimibe (ZETIA) 10 MG tablet Take 1 tablet (10 mg total) by mouth daily.  90 tablet  0  . hydrochlorothiazide 25 MG tablet Take 1 tablet (25 mg total) by mouth daily.  30  tablet  6  . memantine (NAMENDA) 10 MG tablet Take 10 mg by mouth at bedtime.        . Multiple Vitamins-Minerals (CENTRUM SILVER ULTRA WOMENS PO) Take 1 tablet by mouth daily.        . nabumetone (RELAFEN) 500 MG tablet Take 1 tablet (500 mg total) by mouth daily.  90 tablet  3  . potassium chloride SA (K-DUR,KLOR-CON) 20 MEQ tablet 3 daily       . ramipril (ALTACE) 10 MG capsule Take 20 mg by mouth daily.        . rivastigmine (EXELON) 9.5 mg/24hr Place 1 patch onto the skin daily.        . Syringe/Needle, Disp, (B-D ECLIPSE SYRINGE) 27G X 1/2" 1 ML MISC by Does not apply route. Use as directed for b12 shots         ROS Negative other than HPI.   PE General Appearance: well developed, well nourished in no acute distress, obese, sitting in a wheelchair HEENT: symmetrical face, PERRLA, good dentition  Neck: no JVD, thyromegaly, or adenopathy, trachea midline Chest: symmetric without deformity Cardiac: PMI non-displaced, RRR, normal S1, S2, no gallop or  murmur Lung: clear to ausculation and percussion Vascular: all pulses full without bruits  Abdominal: nondistended, nontender, good bowel sounds, no HSM, no bruits Extremities: no cyanosis, clubbing , 2+ pitting edema, no sign of DVT, no varicosities  Skin: normal color, no rashes Neuro: alert and oriented x 3, non-focal Pysch: normal affect Filed Vitals:   07/31/11 1018  BP: 126/78  Pulse: 64  Height: 5\' 6"  (1.676 m)    EKG  Labs and Studies Reviewed.   Lab Results  Component Value Date   WBC 5.3 05/10/2009   HGB 13.2 05/10/2009   HCT 38.0 05/10/2009   MCV 90.9 05/10/2009   PLT 245.0 05/10/2009      Chemistry      Component Value Date/Time   NA 135 08/21/2009 1700   K 4.3 08/21/2009 1700   CL 101 08/21/2009 1700   CO2 25 08/21/2009 1700   BUN 17 08/21/2009 1700   CREATININE 1.0 08/21/2009 1700      Component Value Date/Time   CALCIUM 9.0 08/21/2009 1700   ALKPHOS 126* 08/12/2010 0919   AST 18 08/12/2010 0919   ALT 14 08/12/2010  0919   BILITOT 0.8 08/12/2010 0919       Lab Results  Component Value Date   CHOL 161 08/12/2010   CHOL 138 02/21/2010   CHOL 146 08/17/2009   Lab Results  Component Value Date   HDL 55.80 08/12/2010   HDL 16.10 02/21/2010   HDL 96.04 08/17/2009   Lab Results  Component Value Date   LDLCALC 83 08/12/2010   LDLCALC 52 02/21/2010   LDLCALC 68 08/17/2009   Lab Results  Component Value Date   TRIG 113.0 08/12/2010   TRIG 101.0 02/21/2010   TRIG 84.0 08/17/2009   Lab Results  Component Value Date   CHOLHDL 3 08/12/2010   CHOLHDL 2 02/21/2010   CHOLHDL 2 08/17/2009   No results found for this basename: HGBA1C   Lab Results  Component Value Date   ALT 14 08/12/2010   AST 18 08/12/2010   ALKPHOS 126* 08/12/2010   BILITOT 0.8 08/12/2010   Lab Results  Component Value Date   TSH 1.31 05/10/2009

## 2011-07-31 NOTE — Patient Instructions (Addendum)
Your physician recommends that you schedule a follow-up appointment in: 1 year with Dr. Daleen Squibb  Your physician recommends that you return for fasting lab work on:  [employer] on Tuesday August 21,2012  You have been given a prescription for Nitroglycerin to use for chest pain (angina).  Please see the handout.  Take no more than 3 sublingual NTG in a 15 minute period of time. You may also take 1 extra aspirin in addition to the NTG if you experience chest pain

## 2011-07-31 NOTE — Assessment & Plan Note (Signed)
At goal. Repeat annual blood work.

## 2011-08-05 ENCOUNTER — Other Ambulatory Visit (INDEPENDENT_AMBULATORY_CARE_PROVIDER_SITE_OTHER): Payer: Medicare Other

## 2011-08-05 DIAGNOSIS — E785 Hyperlipidemia, unspecified: Secondary | ICD-10-CM

## 2011-08-05 LAB — HEPATIC FUNCTION PANEL
ALT: 15 U/L (ref 0–35)
AST: 20 U/L (ref 0–37)
Albumin: 3.6 g/dL (ref 3.5–5.2)

## 2011-08-05 LAB — LIPID PANEL
HDL: 60.9 mg/dL (ref >39.00)
Total CHOL/HDL Ratio: 2
Triglycerides: 71 mg/dL (ref 0.0–149.0)
VLDL: 14.2 mg/dL (ref 0.0–40.0)

## 2011-08-06 ENCOUNTER — Other Ambulatory Visit: Payer: Self-pay | Admitting: Family Medicine

## 2011-08-12 ENCOUNTER — Other Ambulatory Visit: Payer: Self-pay | Admitting: Family Medicine

## 2011-08-12 MED ORDER — RAMIPRIL 10 MG PO CAPS: 20.0000 mg | ORAL_CAPSULE | Freq: Every day | ORAL | Status: DC

## 2011-08-12 NOTE — Telephone Encounter (Signed)
Pt called req refill of ramipril (ALTACE) 10 MG capsule to [facility] [phone].  Also call in to [facility] until [facility] order arrives.  Pt is completley out of med.

## 2011-08-14 ENCOUNTER — Encounter: Payer: Self-pay | Admitting: *Deleted

## 2011-09-08 LAB — HM MAMMOGRAPHY

## 2011-09-09 ENCOUNTER — Encounter: Payer: Self-pay | Admitting: Family Medicine

## 2011-09-09 ENCOUNTER — Telehealth: Payer: Self-pay | Admitting: *Deleted

## 2011-09-09 MED ORDER — POTASSIUM CHLORIDE CRYS ER 20 MEQ PO TBCR: 20.0000 meq | EXTENDED_RELEASE_TABLET | Freq: Three times a day (TID) | ORAL | Status: DC

## 2011-09-09 NOTE — Telephone Encounter (Signed)
Husband is asking if [name] will send Klor Con [facility].  New phone number for pt is [phone]

## 2011-09-14 ENCOUNTER — Other Ambulatory Visit: Payer: Self-pay | Admitting: Family Medicine

## 2011-09-15 ENCOUNTER — Ambulatory Visit (INDEPENDENT_AMBULATORY_CARE_PROVIDER_SITE_OTHER): Payer: Medicare Other

## 2011-09-15 DIAGNOSIS — Z23 Encounter for immunization: Secondary | ICD-10-CM

## 2011-09-19 ENCOUNTER — Other Ambulatory Visit: Payer: Self-pay | Admitting: Family Medicine

## 2011-09-24 ENCOUNTER — Other Ambulatory Visit: Payer: Self-pay | Admitting: Family Medicine

## 2011-09-30 ENCOUNTER — Telehealth: Payer: Self-pay | Admitting: Cardiology

## 2011-09-30 MED ORDER — HYDROCHLOROTHIAZIDE 25 MG PO TABS: 25.0000 mg | ORAL_TABLET | Freq: Every day | ORAL | Status: DC

## 2011-09-30 MED ORDER — POTASSIUM CHLORIDE CRYS ER 20 MEQ PO TBCR: 20.0000 meq | EXTENDED_RELEASE_TABLET | Freq: Every day | ORAL | Status: DC

## 2011-09-30 NOTE — Telephone Encounter (Signed)
Needs refill klor con and hctz, [facility] , pt out needs asap

## 2011-10-20 ENCOUNTER — Other Ambulatory Visit: Payer: Self-pay | Admitting: Family Medicine

## 2012-01-15 ENCOUNTER — Other Ambulatory Visit: Payer: Self-pay | Admitting: Cardiology

## 2012-01-15 MED ORDER — ATORVASTATIN CALCIUM 80 MG PO TABS: 80.0000 mg | ORAL_TABLET | Freq: Every day | ORAL | Status: DC

## 2012-01-15 NOTE — Telephone Encounter (Signed)
New msg: Pharmacy said they cannot refill medication with a new RX. Please call in a new RX for lipitor 80 mg for pt.   Please return pt call to inform that RX was called into [facility].

## 2012-02-14 ENCOUNTER — Other Ambulatory Visit: Payer: Self-pay | Admitting: Family Medicine

## 2012-02-16 ENCOUNTER — Other Ambulatory Visit: Payer: Self-pay | Admitting: *Deleted

## 2012-02-16 MED ORDER — RAMIPRIL 10 MG PO CAPS: 20.0000 mg | ORAL_CAPSULE | Freq: Every day | ORAL | Status: DC

## 2012-04-25 ENCOUNTER — Other Ambulatory Visit: Payer: Self-pay | Admitting: Cardiology

## 2012-05-15 ENCOUNTER — Other Ambulatory Visit: Payer: Self-pay | Admitting: Family Medicine

## 2012-06-21 ENCOUNTER — Telehealth: Payer: Self-pay | Admitting: Family Medicine

## 2012-06-21 MED ORDER — NABUMETONE 500 MG PO TABS: 500.0000 mg | ORAL_TABLET | Freq: Two times a day (BID) | ORAL | Status: DC

## 2012-06-21 NOTE — Telephone Encounter (Signed)
Pts spouse called and said that his wife needs refill of nabumetone (RELAFEN) 500 MG tablet called in to [facility]. Pt is almost out of med.

## 2012-07-01 ENCOUNTER — Encounter: Payer: Self-pay | Admitting: *Deleted

## 2012-07-23 ENCOUNTER — Ambulatory Visit: Payer: Medicare Other | Admitting: Cardiology

## 2012-08-17 ENCOUNTER — Other Ambulatory Visit: Payer: Self-pay | Admitting: Family Medicine

## 2012-08-17 NOTE — Telephone Encounter (Signed)
Pt husband is requesting zetia 10 mg #90 with 3 refills and ramipril 10 mg 2 capsules by mouth daily #180  With [facility] [phone]. Pt no longer wants zetia sent to [facility]

## 2012-08-18 ENCOUNTER — Other Ambulatory Visit: Payer: Self-pay | Admitting: *Deleted

## 2012-08-18 MED ORDER — HYDROCHLOROTHIAZIDE 25 MG PO TABS: 25.0000 mg | ORAL_TABLET | Freq: Every day | ORAL | Status: DC

## 2012-08-18 MED ORDER — POTASSIUM CHLORIDE CRYS ER 20 MEQ PO TBCR: 20.0000 meq | EXTENDED_RELEASE_TABLET | Freq: Every day | ORAL | Status: DC

## 2012-08-18 NOTE — Telephone Encounter (Signed)
Pt is sch for 08-30-12 230pm

## 2012-08-18 NOTE — Telephone Encounter (Signed)
Patient has not been seen since 2011. Office visit needed for more refills.

## 2012-08-23 ENCOUNTER — Encounter: Payer: Self-pay | Admitting: Cardiology

## 2012-08-23 ENCOUNTER — Ambulatory Visit (INDEPENDENT_AMBULATORY_CARE_PROVIDER_SITE_OTHER): Payer: Medicare Other | Admitting: Cardiology

## 2012-08-23 VITALS — BP 128/68 | HR 59 | Ht 66.0 in | Wt 230.0 lb

## 2012-08-23 DIAGNOSIS — E785 Hyperlipidemia, unspecified: Secondary | ICD-10-CM

## 2012-08-23 DIAGNOSIS — IMO0002 Reserved for concepts with insufficient information to code with codable children: Secondary | ICD-10-CM

## 2012-08-23 DIAGNOSIS — I2510 Atherosclerotic heart disease of native coronary artery without angina pectoris: Secondary | ICD-10-CM

## 2012-08-23 NOTE — Patient Instructions (Addendum)
Your physician wants you to follow-up in:  12 months.  You will receive a reminder letter in the mail two months in advance. If you don't receive a letter, please call our office to schedule the follow-up appointment.   

## 2012-08-23 NOTE — Assessment & Plan Note (Signed)
Stable. Continue secondary preventative therapy. Return the office in one year. 

## 2012-08-23 NOTE — Progress Notes (Signed)
HPI [name] returns today for evaluation and management of her nonobstructive coronary artery disease. He is having no angina or chest discomfort. She sits in a chair most the day but change it up and around without difficulty. Her husband confirms this today. She denies orthopnea, PND or edema. She's had no palpitations. She seems to be very compliant with her medications.  Past Medical History  Diagnosis Date  . Coronary atherosclerosis of unspecified type of vessel, native or graft     nonobstructive; unspecified site  . Complications affecting other specified body systems, hypertension   . Other and unspecified hyperlipidemia   . Other B-complex deficiencies   . Localized osteoarthrosis not specified whether primary or secondary, unspecified site   . Memory loss   . Unspecified essential hypertension   . Arthritis     Current Outpatient Prescriptions  Medication Sig Dispense Refill  . aspirin (ADULT ASPIRIN EC LOW STRENGTH) 81 MG EC tablet Take 81 mg by mouth daily.        Marland Kitchen atorvastatin (LIPITOR) 80 MG tablet Take 1 tablet (80 mg total) by mouth at bedtime.  90 tablet  3  . Calcium-Vitamin D 600-125 MG-UNIT TABS Take 1 tablet by mouth daily.        . cyanocobalamin (,VITAMIN B-12,) 1000 MCG/ML injection Inject into the muscle. 1 cc weekly for two months, then 1 cc monthly       . hydrochlorothiazide (HYDRODIURIL) 25 MG tablet Take 1 tablet (25 mg total) by mouth daily.  90 tablet  4  . memantine (NAMENDA) 10 MG tablet Take 10 mg by mouth at bedtime.        . Multiple Vitamins-Minerals (CENTRUM SILVER ULTRA WOMENS PO) Take 1 tablet by mouth daily.        . nabumetone (RELAFEN) 500 MG tablet Take 1 tablet (500 mg total) by mouth 2 (two) times daily.  180 tablet  0  . nitroGLYCERIN (NITROSTAT) 0.4 MG SL tablet Place 0.4 mg under the tongue every 5 (five) minutes as needed.      . potassium chloride SA (KLOR-CON M20) 20 MEQ tablet Take 1 tablet (20 mEq total) by mouth daily.  90 tablet   4  . QUEtiapine (SEROQUEL) 25 MG tablet Take 25 mg by mouth at bedtime.      . ramipril (ALTACE) 10 MG capsule Take 2 capsules (20 mg total) by mouth daily.  90 capsule  3  . rivastigmine (EXELON) 9.5 mg/24hr Place 1 patch onto the skin daily.        . Syringe/Needle, Disp, (B-D ECLIPSE SYRINGE) 27G X 1/2" 1 ML MISC by Does not apply route. Use as directed for b12 shots       . ZETIA 10 MG tablet TAKE 1 TABLET (10 MG TOTAL) BY MOUTH DAILY.  90 tablet  0  . DISCONTD: nitroGLYCERIN (NITROSTAT) 0.4 MG SL tablet Place 1 tablet (0.4 mg total) under the tongue every 5 (five) minutes as needed for chest pain.  90 tablet  12    Allergies  Allergen Reactions  . Norvasc (Amlodipine Besylate)     No family history on file.  History   Social History  . Marital Status: Married    Spouse Name: N/A    Number of Children: N/A  . Years of Education: N/A   Occupational History  . Retired    Social History Main Topics  . Smoking status: Never Smoker   . Smokeless tobacco: Not on file  . Alcohol Use:  No  . Drug Use: No  . Sexually Active: Not on file   Other Topics Concern  . Not on file   Social History Narrative  . No narrative on file    ROS ALL NEGATIVE EXCEPT THOSE NOTED IN HPI  PE  General Appearance: well developed, well nourished in no acute distress, obese HEENT: symmetrical face, PERRLA, good dentition  Neck: no JVD, thyromegaly, or adenopathy, trachea midline Chest: symmetric without deformity Cardiac: PMI difficult to appreciate, RRR, normal S1, S2, no gallop or murmur Lung: clear to ausculation and percussion Vascular: all pulses full except  slightly decreased in the lower extremities Abdominal: nondistended, nontender, good bowel sounds, no HSM, no bruits Extremities: no cyanosis, clubbing or edema, no sign of DVT, no varicosities  Skin: normal color, no rashes Neuro: alert and oriented x 3, non-focal Pysch: normal affect  EKG Normal sinus rhythm, low-voltage,  no acute changes BMET    Component Value Date/Time   NA 135 08/21/2009 1700   K 4.3 08/21/2009 1700   CL 101 08/21/2009 1700   CO2 25 08/21/2009 1700   GLUCOSE 84 08/21/2009 1700   GLUCOSE 110* 10/19/2006 0813   BUN 17 08/21/2009 1700   CREATININE 1.0 08/21/2009 1700   CALCIUM 9.0 08/21/2009 1700   GFRNONAA 56.77 08/21/2009 1700   GFRAA 62 02/18/2008 1006    Lipid Panel     Component Value Date/Time   CHOL 132 08/05/2011 0916   TRIG 71.0 08/05/2011 0916   HDL 60.90 08/05/2011 0916   CHOLHDL 2 08/05/2011 0916   VLDL 14.2 08/05/2011 0916   LDLCALC 57 08/05/2011 0916    CBC    Component Value Date/Time   WBC 5.3 05/10/2009 1035   RBC 4.18 05/10/2009 1035   HGB 13.2 05/10/2009 1035   HCT 38.0 05/10/2009 1035   PLT 245.0 05/10/2009 1035   MCV 90.9 05/10/2009 1035   MCHC 34.7 05/10/2009 1035   RDW 13.4 05/10/2009 1035   LYMPHSABS 0.8 05/10/2009 1035   MONOABS 0.4 05/10/2009 1035   EOSABS 0.0 05/10/2009 1035   BASOSABS 0.1 05/10/2009 1035

## 2012-08-30 ENCOUNTER — Ambulatory Visit (INDEPENDENT_AMBULATORY_CARE_PROVIDER_SITE_OTHER): Payer: Medicare Other | Admitting: Family Medicine

## 2012-08-30 ENCOUNTER — Encounter: Payer: Self-pay | Admitting: Family Medicine

## 2012-08-30 VITALS — BP 110/70

## 2012-08-30 DIAGNOSIS — R413 Other amnesia: Secondary | ICD-10-CM

## 2012-08-30 DIAGNOSIS — E785 Hyperlipidemia, unspecified: Secondary | ICD-10-CM

## 2012-08-30 DIAGNOSIS — M1990 Unspecified osteoarthritis, unspecified site: Secondary | ICD-10-CM

## 2012-08-30 DIAGNOSIS — Z23 Encounter for immunization: Secondary | ICD-10-CM

## 2012-08-30 DIAGNOSIS — I2510 Atherosclerotic heart disease of native coronary artery without angina pectoris: Secondary | ICD-10-CM

## 2012-08-30 DIAGNOSIS — E538 Deficiency of other specified B group vitamins: Secondary | ICD-10-CM

## 2012-08-30 DIAGNOSIS — IMO0002 Reserved for concepts with insufficient information to code with codable children: Secondary | ICD-10-CM

## 2012-08-30 LAB — CBC WITH DIFFERENTIAL/PLATELET
Basophils Absolute: 0 10*3/uL (ref 0.0–0.1)
Eosinophils Relative: 1.6 % (ref 0.0–5.0)
HCT: 36.7 % (ref 36.0–46.0)
Lymphocytes Relative: 24.7 % (ref 12.0–46.0)
Lymphs Abs: 1 10*3/uL (ref 0.7–4.0)
Monocytes Relative: 7.7 % (ref 3.0–12.0)
Platelets: 194 10*3/uL (ref 150.0–400.0)
RDW: 14.6 % (ref 11.5–14.6)
WBC: 3.9 10*3/uL — ABNORMAL LOW (ref 4.5–10.5)

## 2012-08-30 LAB — BASIC METABOLIC PANEL
CO2: 25 mEq/L (ref 19–32)
Chloride: 105 mEq/L (ref 96–112)
Potassium: 3.9 mEq/L (ref 3.5–5.1)
Sodium: 138 mEq/L (ref 135–145)

## 2012-08-30 LAB — TSH: TSH: 1.6 u[IU]/mL (ref 0.35–5.50)

## 2012-08-30 MED ORDER — CYANOCOBALAMIN 1000 MCG/ML IJ SOLN
1000.0000 ug | Freq: Once | INTRAMUSCULAR | Status: AC
  Administered 2012-08-30: 1000 ug via INTRAMUSCULAR

## 2012-08-30 MED ORDER — ATORVASTATIN CALCIUM 80 MG PO TABS: 80.0000 mg | ORAL_TABLET | Freq: Every day | ORAL | Status: DC

## 2012-08-30 MED ORDER — POTASSIUM CHLORIDE CRYS ER 20 MEQ PO TBCR: 20.0000 meq | EXTENDED_RELEASE_TABLET | Freq: Every day | ORAL | Status: DC

## 2012-08-30 MED ORDER — MEMANTINE HCL 10 MG PO TABS: 10.0000 mg | ORAL_TABLET | Freq: Every day | ORAL | Status: DC

## 2012-08-30 MED ORDER — RAMIPRIL 10 MG PO CAPS: ORAL_CAPSULE | ORAL | Status: DC

## 2012-08-30 MED ORDER — CYANOCOBALAMIN 1000 MCG/ML IJ SOLN: 1000.0000 ug | INTRAMUSCULAR | Status: DC

## 2012-08-30 MED ORDER — QUETIAPINE FUMARATE 25 MG PO TABS: 25.0000 mg | ORAL_TABLET | Freq: Every day | ORAL | Status: DC

## 2012-08-30 MED ORDER — RIVASTIGMINE 9.5 MG/24HR TD PT24: 1.0000 | MEDICATED_PATCH | Freq: Every day | TRANSDERMAL | Status: DC

## 2012-08-30 MED ORDER — HYDROCHLOROTHIAZIDE 25 MG PO TABS: 25.0000 mg | ORAL_TABLET | Freq: Every day | ORAL | Status: DC

## 2012-08-30 NOTE — Progress Notes (Signed)
  Subjective:    Patient ID: [name], female    DOB: 01-03-1928, 78 y.o.   MRN: [id number]  HPI [name] is a 78 year old female with progressive dementia who comes in today accompanied by her husband who is her primary caregiver now. She is on multiple medications as outlined above. She states she feels well stable no problems.  BP today 110/70 therefore we'll decrease the Altase 10 mg daily instead of 20.   Review of Systems Gen. review of systems otherwise negative    Objective:   Physical Exam Well-developed well-nourished female no acute distress cardiopulmonary exam normal       Assessment & Plan:  Severe progressive dementia continue current medications  Hypertension continue current medicines except decrease Lasix to 10 mg daily because blood pressure low 110/70  Hyperlipidemia continue Lipitor DC Zetia  History of pernicious anemia continue B12 1 cc monthly

## 2012-08-30 NOTE — Patient Instructions (Signed)
Stop the Zetia  And decrease the Altase to one tablet daily  Return in one year sooner if any problems  Ask Victorino Dike  to give her 1 cc of B12 monthly

## 2012-09-10 ENCOUNTER — Other Ambulatory Visit: Payer: Self-pay | Admitting: *Deleted

## 2012-09-10 MED ORDER — "SYRINGE/NEEDLE (DISP) 27G X 1/2"" 1 ML MISC": 1.0000 | Status: DC

## 2012-09-13 ENCOUNTER — Telehealth: Payer: Self-pay | Admitting: Family Medicine

## 2012-09-13 MED ORDER — "SYRINGE/NEEDLE (DISP) 27G X 1/2"" 1 ML MISC": Status: DC

## 2012-09-13 MED ORDER — "SYRINGE/NEEDLE (DISP) 27G X 1/2"" 1 ML MISC": 1.0000 | Status: DC

## 2012-09-13 NOTE — Telephone Encounter (Signed)
Pt received cyanocobalamin needs 4 syringes [facility]. Also needs new [facility].

## 2012-09-13 NOTE — Telephone Encounter (Signed)
Rx sent 

## 2012-10-04 ENCOUNTER — Other Ambulatory Visit (INDEPENDENT_AMBULATORY_CARE_PROVIDER_SITE_OTHER): Payer: Medicare Other

## 2012-10-04 DIAGNOSIS — R799 Abnormal finding of blood chemistry, unspecified: Secondary | ICD-10-CM

## 2012-10-04 DIAGNOSIS — R7989 Other specified abnormal findings of blood chemistry: Secondary | ICD-10-CM

## 2012-10-04 LAB — BASIC METABOLIC PANEL
Chloride: 106 mEq/L (ref 96–112)
Potassium: 3.8 mEq/L (ref 3.5–5.1)
Sodium: 141 mEq/L (ref 135–145)

## 2012-11-17 ENCOUNTER — Telehealth: Payer: Self-pay | Admitting: Family Medicine

## 2012-11-17 DIAGNOSIS — R413 Other amnesia: Secondary | ICD-10-CM

## 2012-11-17 DIAGNOSIS — IMO0002 Reserved for concepts with insufficient information to code with codable children: Secondary | ICD-10-CM

## 2012-11-17 DIAGNOSIS — E785 Hyperlipidemia, unspecified: Secondary | ICD-10-CM

## 2012-11-17 DIAGNOSIS — I2510 Atherosclerotic heart disease of native coronary artery without angina pectoris: Secondary | ICD-10-CM

## 2012-11-17 DIAGNOSIS — M1990 Unspecified osteoarthritis, unspecified site: Secondary | ICD-10-CM

## 2012-11-17 DIAGNOSIS — E538 Deficiency of other specified B group vitamins: Secondary | ICD-10-CM

## 2012-11-17 MED ORDER — RAMIPRIL 10 MG PO CAPS: ORAL_CAPSULE | ORAL | Status: DC

## 2012-11-17 NOTE — Telephone Encounter (Signed)
Pt spouse called and said that there is a delay in the shipping of pts ramipril (ALTACE) 10 MG capsule. Pt needs a 10 day supply called in to a local pharmacy[facility]

## 2012-11-17 NOTE — Telephone Encounter (Signed)
Rx sent to pharmacy   

## 2013-07-13 ENCOUNTER — Other Ambulatory Visit: Payer: Self-pay

## 2013-07-13 DIAGNOSIS — E538 Deficiency of other specified B group vitamins: Secondary | ICD-10-CM

## 2013-07-13 DIAGNOSIS — I2510 Atherosclerotic heart disease of native coronary artery without angina pectoris: Secondary | ICD-10-CM

## 2013-07-13 DIAGNOSIS — R413 Other amnesia: Secondary | ICD-10-CM

## 2013-07-13 DIAGNOSIS — M1990 Unspecified osteoarthritis, unspecified site: Secondary | ICD-10-CM

## 2013-07-13 DIAGNOSIS — E785 Hyperlipidemia, unspecified: Secondary | ICD-10-CM

## 2013-07-13 DIAGNOSIS — IMO0002 Reserved for concepts with insufficient information to code with codable children: Secondary | ICD-10-CM

## 2013-07-13 MED ORDER — RIVASTIGMINE 13.3 MG/24HR TD PT24: 13.3000 mg | MEDICATED_PATCH | Freq: Every day | TRANSDERMAL | Status: DC

## 2013-07-16 ENCOUNTER — Other Ambulatory Visit: Payer: Self-pay | Admitting: Neurology

## 2013-07-19 ENCOUNTER — Other Ambulatory Visit: Payer: Self-pay | Admitting: Family Medicine

## 2013-08-12 ENCOUNTER — Ambulatory Visit (INDEPENDENT_AMBULATORY_CARE_PROVIDER_SITE_OTHER): Payer: Medicare Other | Admitting: Nurse Practitioner

## 2013-08-12 ENCOUNTER — Encounter: Payer: Self-pay | Admitting: Nurse Practitioner

## 2013-08-12 VITALS — BP 134/67 | HR 66 | Ht 64.0 in | Wt 223.0 lb

## 2013-08-12 DIAGNOSIS — R413 Other amnesia: Secondary | ICD-10-CM

## 2013-08-12 DIAGNOSIS — F0280 Dementia in other diseases classified elsewhere without behavioral disturbance: Secondary | ICD-10-CM

## 2013-08-12 MED ORDER — RIVASTIGMINE 13.3 MG/24HR TD PT24: 13.3000 mg | MEDICATED_PATCH | Freq: Every day | TRANSDERMAL | Status: DC

## 2013-08-12 MED ORDER — MEMANTINE HCL ER 28 MG PO CP24: 28.0000 mg | ORAL_CAPSULE | Freq: Every day | ORAL | Status: DC

## 2013-08-12 MED ORDER — QUETIAPINE FUMARATE 50 MG PO TABS: ORAL_TABLET | ORAL | Status: DC

## 2013-08-12 NOTE — Progress Notes (Signed)
Reason for visit followup for memory  HPI: Ms Florence, 79 year old  Caucasian female with progressive memory loss, likely Alzheimer's dementia returns for followup. Remains on Exelon patch, and Namenda 10 twice daily. Memory score is stable. Seroquel has helped her agitation and behavioral outburst. She can still dress and feed herself as well as take care of her toileting needs. She no longer cooks and they eat out a lot. She denies side effects to her medications    01/11/13: Returns for followup. Memory about the same per husband. Currently on Exelon and Namenda without side effects. Takes Seroquel for agitation and behaviors that has been beneficial. Continues to live in private home.  She was started on Aricept in 2010 and has tolerated that without side effect. She has had worsening behavior and has had episodes of being agitated, belligerent, accusatory, irritable and argumentative, particularly with her husband.  She gets upset when she cannot find things, and accused family members of taking the missing objects.  She has no interest in the same activities. She no longer cooks or does any shopping. She and her husband tend to go out to eat or eat simply prepared foods brought by their daughters. She has  been bathing  washing herself with a damp washcloths.  She can still dress and feed herself as well as taking care of toileting hygiene. She has had a good appetite and sleeps well.  Namenda was begun 1.5 yrs  ago. Exelon was started in Nov, 2011,  and Aricept was discontinued. She was also started on Seroquel and that has helped with her agitation and behavior outbursts that are mostly directed at husband. The plan is to keep her at home as long as possible. Has good and bad days but things appear stable.     ROS:  Of a complete 14 system review of symptoms, all are negative except Cough urinary incontinence easy bruising and memory loss  Medications Current Outpatient Prescriptions on File  Prior to Visit  Medication Sig Dispense Refill  . aspirin (ADULT ASPIRIN EC LOW STRENGTH) 81 MG EC tablet Take 81 mg by mouth daily.        Marland Kitchen atorvastatin (LIPITOR) 80 MG tablet TAKE 1 TABLET AT BEDTIME  90 tablet  2  . Calcium-Vitamin D 600-125 MG-UNIT TABS Take 1 tablet by mouth daily.        . cyanocobalamin (,VITAMIN B-12,) 1000 MCG/ML injection Inject 1 mL (1,000 mcg total) into the skin every 30 (thirty) days. 1 cc weekly for two months, then 1 cc monthly  25 mL  1  . hydrochlorothiazide (HYDRODIURIL) 25 MG tablet Take 1 tablet (25 mg total) by mouth daily.  90 tablet  4  . Multiple Vitamins-Minerals (CENTRUM SILVER ULTRA WOMENS PO) Take 1 tablet by mouth daily.        . nabumetone (RELAFEN) 500 MG tablet Take 1 tablet (500 mg total) by mouth 2 (two) times daily.  180 tablet  0  . potassium chloride SA (KLOR-CON M20) 20 MEQ tablet Take 1 tablet (20 mEq total) by mouth daily.  90 tablet  4  . QUEtiapine (SEROQUEL) 50 MG tablet TAKE 1 TABLET IN THE MORNING, 1 AFTER 2PM AND 1 AT NIGHT  270 tablet  0  . ramipril (ALTACE) 10 MG capsule One tablet daily in the morning  30 capsule  0  . ramipril (ALTACE) 10 MG capsule TAKE 1 CAPSULE DAILY IN THE MORNING  90 capsule  2  . Rivastigmine (EXELON)  13.3 MG/24HR PT24 Place 1 patch (13.3 mg total) onto the skin daily.  90 patch  0  . Syringe/Needle, Disp, (B-D ECLIPSE SYRINGE) 27G X 1/2" 1 ML MISC Use as directed for b12 shots  100 each  3   No current facility-administered medications on file prior to visit.    Allergies  Allergies  Allergen Reactions  . Norvasc [Amlodipine Besylate]     Physical Exam General: well developed, well nourished, seated, in no evident distress Head: head normocephalic and atraumatic. Oropharynx benign Neck: supple with no carotid  bruits Cardiovascular: regular rate and rhythm, no murmurs Skin 1-2+ pitting edema in lower legs and ankles right greater than left  Neurologic Exam Mental Status: Awake and alert.  Mini-Mental status exam 20/30 missing items in orientation and recall . AFT 8. Follows all commands. Speech and language normal.   Cranial Nerves: Pupils equal, briskly reactive to light. Extraocular movements full without nystagmus. Visual fields full to confrontation. Hearing intact and symmetric to finger snap. Facial sensation intact. Face, tongue, palate move normally and symmetrically. Neck flexion and extension normal.  Motor: Normal bulk and tone. Normal strength in all tested extremity muscles.No focal weakness Coordination: Rapid alternating movements normal in all extremities. Finger-to-nose and heel-to-shin performed accurately bilaterally. Gait and Station: Deferred in wheelchair   Reflexes: Diminished and symmetric except for right knee and ankle absent      ASSESSMENT: Progressive memory loss probable Alzheimer's dementia.     PLAN: Continue Exelon patch, we will renew Namenda 28 XR daily, renew Continue Seroquel 3 daily, we will renew Handicap sticker form filled out Followup in 6 months  Nilda Riggs, GNP-BC APRN

## 2013-08-12 NOTE — Patient Instructions (Addendum)
Memory score stable Continue Exelon patch, we will renew Namenda 28 XR daily, renew Continue Seroquel 3 daily, we will renew Followup in 6 months

## 2013-08-14 NOTE — Progress Notes (Signed)
I agree with the assessment and plan as directed by NP .The patient is known to me .   Jazleen Robeck, MD  

## 2013-08-16 ENCOUNTER — Telehealth: Payer: Self-pay | Admitting: Family Medicine

## 2013-08-16 DIAGNOSIS — R413 Other amnesia: Secondary | ICD-10-CM

## 2013-08-16 DIAGNOSIS — E785 Hyperlipidemia, unspecified: Secondary | ICD-10-CM

## 2013-08-16 DIAGNOSIS — E538 Deficiency of other specified B group vitamins: Secondary | ICD-10-CM

## 2013-08-16 DIAGNOSIS — IMO0002 Reserved for concepts with insufficient information to code with codable children: Secondary | ICD-10-CM

## 2013-08-16 DIAGNOSIS — M1990 Unspecified osteoarthritis, unspecified site: Secondary | ICD-10-CM

## 2013-08-16 DIAGNOSIS — I2510 Atherosclerotic heart disease of native coronary artery without angina pectoris: Secondary | ICD-10-CM

## 2013-08-16 MED ORDER — CYANOCOBALAMIN 1000 MCG/ML IJ SOLN: 100.0000 ug | Freq: Once | INTRAMUSCULAR | Status: DC

## 2013-08-16 NOTE — Telephone Encounter (Signed)
Pt refill on cyanocobalamin 1000 mcg no syringes call  [facility] [phone]

## 2013-08-16 NOTE — Telephone Encounter (Signed)
Rx to pharamcy

## 2013-08-18 ENCOUNTER — Other Ambulatory Visit: Payer: Self-pay

## 2013-08-18 MED ORDER — MEMANTINE HCL ER 28 MG PO CP24: 28.0000 mg | ORAL_CAPSULE | Freq: Every day | ORAL | Status: DC

## 2013-08-20 ENCOUNTER — Other Ambulatory Visit: Payer: Self-pay | Admitting: Neurology

## 2013-08-25 ENCOUNTER — Other Ambulatory Visit: Payer: Self-pay | Admitting: Family Medicine

## 2013-08-30 ENCOUNTER — Ambulatory Visit (INDEPENDENT_AMBULATORY_CARE_PROVIDER_SITE_OTHER): Payer: Medicare Other

## 2013-08-30 DIAGNOSIS — Z23 Encounter for immunization: Secondary | ICD-10-CM

## 2013-09-06 ENCOUNTER — Other Ambulatory Visit: Payer: Self-pay | Admitting: Family Medicine

## 2013-09-08 ENCOUNTER — Other Ambulatory Visit: Payer: Self-pay | Admitting: Family Medicine

## 2013-09-08 ENCOUNTER — Other Ambulatory Visit: Payer: Self-pay

## 2013-09-08 MED ORDER — MEMANTINE HCL 10 MG PO TABS: 10.0000 mg | ORAL_TABLET | Freq: Two times a day (BID) | ORAL | Status: DC

## 2013-09-23 ENCOUNTER — Telehealth: Payer: Self-pay | Admitting: Family Medicine

## 2013-09-23 DIAGNOSIS — I2510 Atherosclerotic heart disease of native coronary artery without angina pectoris: Secondary | ICD-10-CM

## 2013-09-23 DIAGNOSIS — E538 Deficiency of other specified B group vitamins: Secondary | ICD-10-CM

## 2013-09-23 DIAGNOSIS — M1990 Unspecified osteoarthritis, unspecified site: Secondary | ICD-10-CM

## 2013-09-23 DIAGNOSIS — R413 Other amnesia: Secondary | ICD-10-CM

## 2013-09-23 DIAGNOSIS — IMO0002 Reserved for concepts with insufficient information to code with codable children: Secondary | ICD-10-CM

## 2013-09-23 DIAGNOSIS — E785 Hyperlipidemia, unspecified: Secondary | ICD-10-CM

## 2013-09-23 MED ORDER — CYANOCOBALAMIN 1000 MCG/ML IJ SOLN: 100.0000 ug | Freq: Once | INTRAMUSCULAR | Status: DC

## 2013-09-23 NOTE — Telephone Encounter (Signed)
Pt needs refill on cyanocobalamin . Pt does not need needles. [facility]

## 2013-11-15 ENCOUNTER — Telehealth: Payer: Self-pay | Admitting: Neurology

## 2013-11-15 DIAGNOSIS — F0280 Dementia in other diseases classified elsewhere without behavioral disturbance: Secondary | ICD-10-CM

## 2013-11-16 ENCOUNTER — Other Ambulatory Visit: Payer: Self-pay | Admitting: Family Medicine

## 2013-11-17 NOTE — Telephone Encounter (Signed)
i ordered the vacine - can you check [facility] ? I am not sure.

## 2013-11-18 MED ORDER — VARICELLA-ZOSTER IMMUNE GLOB 125 UNITS IJ SOLR: INTRAMUSCULAR | Status: DC

## 2013-11-18 NOTE — Telephone Encounter (Signed)
[facility]  They do have vaccine and the prescription.  Pt may [facility] to [facility] show her [facility] card and get the vaccine that way or can get thru the pharmacist (after they run this thru the insurance).  I relayed this to husband and he will call  On Monday and pt to get on Monday.

## 2013-11-18 NOTE — Telephone Encounter (Signed)
This was entered as an order, not on med list as a Rx.  I entered it on the med list.  System would not allow me to send it electronically, so I [facility].  Spoke with Jonny Ruiz, who transferred me to the pharmacist.  She took order, and said they do have meds in stock.

## 2013-12-02 ENCOUNTER — Telehealth: Payer: Self-pay | Admitting: Neurology

## 2013-12-23 ENCOUNTER — Encounter (HOSPITAL_COMMUNITY): Payer: Self-pay | Admitting: Emergency Medicine

## 2013-12-23 ENCOUNTER — Emergency Department (HOSPITAL_COMMUNITY)
Admission: EM | Admit: 2013-12-23 | Discharge: 2013-12-24 | Disposition: A | Discharge status: Home / Self Care | Payer: Medicare Other | Attending: Emergency Medicine | Admitting: Emergency Medicine

## 2013-12-23 ENCOUNTER — Emergency Department (HOSPITAL_COMMUNITY): Payer: Medicare Other

## 2013-12-23 DIAGNOSIS — Y939 Activity, unspecified: Secondary | ICD-10-CM | POA: Insufficient documentation

## 2013-12-23 DIAGNOSIS — I10 Essential (primary) hypertension: Secondary | ICD-10-CM | POA: Insufficient documentation

## 2013-12-23 DIAGNOSIS — Y929 Unspecified place or not applicable: Secondary | ICD-10-CM | POA: Insufficient documentation

## 2013-12-23 DIAGNOSIS — M1990 Unspecified osteoarthritis, unspecified site: Secondary | ICD-10-CM | POA: Insufficient documentation

## 2013-12-23 DIAGNOSIS — W19XXXA Unspecified fall, initial encounter: Secondary | ICD-10-CM

## 2013-12-23 DIAGNOSIS — R296 Repeated falls: Secondary | ICD-10-CM | POA: Insufficient documentation

## 2013-12-23 DIAGNOSIS — S0083XA Contusion of other part of head, initial encounter: Secondary | ICD-10-CM

## 2013-12-23 DIAGNOSIS — E785 Hyperlipidemia, unspecified: Secondary | ICD-10-CM | POA: Insufficient documentation

## 2013-12-23 DIAGNOSIS — Z7982 Long term (current) use of aspirin: Secondary | ICD-10-CM | POA: Insufficient documentation

## 2013-12-23 DIAGNOSIS — Z95818 Presence of other cardiac implants and grafts: Secondary | ICD-10-CM | POA: Insufficient documentation

## 2013-12-23 DIAGNOSIS — S1093XA Contusion of unspecified part of neck, initial encounter: Principal | ICD-10-CM

## 2013-12-23 DIAGNOSIS — M129 Arthropathy, unspecified: Secondary | ICD-10-CM | POA: Insufficient documentation

## 2013-12-23 DIAGNOSIS — Z79899 Other long term (current) drug therapy: Secondary | ICD-10-CM | POA: Insufficient documentation

## 2013-12-23 DIAGNOSIS — I2510 Atherosclerotic heart disease of native coronary artery without angina pectoris: Secondary | ICD-10-CM | POA: Insufficient documentation

## 2013-12-23 DIAGNOSIS — R8271 Bacteriuria: Secondary | ICD-10-CM

## 2013-12-23 DIAGNOSIS — R82998 Other abnormal findings in urine: Secondary | ICD-10-CM | POA: Insufficient documentation

## 2013-12-23 DIAGNOSIS — S0003XA Contusion of scalp, initial encounter: Secondary | ICD-10-CM | POA: Insufficient documentation

## 2013-12-23 LAB — BASIC METABOLIC PANEL
BUN: 21 mg/dL (ref 6–23)
CALCIUM: 8.8 mg/dL (ref 8.4–10.5)
CO2: 25 mEq/L (ref 19–32)
Chloride: 101 mEq/L (ref 96–112)
Creatinine, Ser: 1.16 mg/dL — ABNORMAL HIGH (ref 0.50–1.10)
GFR calc Af Amer: 49 mL/min — ABNORMAL LOW (ref >90)
GFR calc non Af Amer: 42 mL/min — ABNORMAL LOW (ref >90)
GLUCOSE: 138 mg/dL — AB (ref 70–99)
Potassium: 3.6 mEq/L — ABNORMAL LOW (ref 3.7–5.3)
Sodium: 140 mEq/L (ref 137–147)

## 2013-12-23 LAB — CBC
HEMATOCRIT: 37.5 % (ref 36.0–46.0)
Hemoglobin: 12.5 g/dL (ref 12.0–15.0)
MCH: 31.3 pg (ref 26.0–34.0)
MCHC: 33.3 g/dL (ref 30.0–36.0)
MCV: 94 fL (ref 78.0–100.0)
Platelets: 208 10*3/uL (ref 150–400)
RBC: 3.99 MIL/uL (ref 3.87–5.11)
RDW: 13.4 % (ref 11.5–15.5)
WBC: 4.8 10*3/uL (ref 4.0–10.5)

## 2013-12-23 LAB — POCT I-STAT TROPONIN I: Troponin i, poc: 0 ng/mL (ref 0.00–0.08)

## 2013-12-23 NOTE — ED Notes (Signed)
Patient transported to CT 

## 2013-12-23 NOTE — ED Provider Notes (Signed)
CSN: [id number]     Arrival date & time 12/23/13  2039 History   First MD Initiated Contact with Patient 12/23/13 2105     Chief Complaint  Patient presents with  . Fall   (Consider location/radiation/quality/duration/timing/severity/associated sxs/prior Treatment) Patient is a 80 y.o. female presenting with fall.  Fall This is a new problem. The current episode started today. The problem occurs rarely. The problem has been resolved. Associated symptoms include headaches (associated with abrasion on r temple). Pertinent negatives include no abdominal pain, chest pain, chills, coughing, fever, nausea, neck pain, numbness, rash, sore throat, vomiting or weakness. Nothing aggravates the symptoms.   Past Medical History  Diagnosis Date  . Coronary atherosclerosis of unspecified type of vessel, native or graft     nonobstructive; unspecified site  . Complications affecting other specified body systems, hypertension   . Other and unspecified hyperlipidemia   . Other B-complex deficiencies   . Localized osteoarthrosis not specified whether primary or secondary, unspecified site   . Memory loss   . Unspecified essential hypertension   . Arthritis    Past Surgical History  Procedure Laterality Date  . Cardiac catheterization  1992    real a 20% lesion in the LAD   No family history on file. History  Substance Use Topics  . Smoking status: Never Smoker   . Smokeless tobacco: Never Used  . Alcohol Use: No   OB History   Grav Para Term Preterm Abortions TAB SAB Ect Mult Living                 Review of Systems  Constitutional: Negative for fever and chills.  HENT: Negative for sore throat.   Eyes: Negative for pain.  Respiratory: Negative for cough and shortness of breath.   Cardiovascular: Negative for chest pain.  Gastrointestinal: Negative for nausea, vomiting, abdominal pain and diarrhea.  Genitourinary: Negative for dysuria.  Musculoskeletal: Negative for back pain and neck  pain.  Skin: Negative for rash.  Neurological: Positive for headaches (associated with abrasion on r temple). Negative for weakness and numbness.    Allergies  Review of patient's allergies indicates no known allergies.  Home Medications   Current Outpatient Rx  Name  Route  Sig  Dispense  Refill  . aspirin (ADULT ASPIRIN EC LOW STRENGTH) 81 MG EC tablet   Oral   Take 81 mg by mouth daily.           Marland Kitchen. atorvastatin (LIPITOR) 80 MG tablet   Oral   Take 80 mg by mouth at bedtime.         . Calcium-Vitamin D 600-125 MG-UNIT TABS   Oral   Take 1 tablet by mouth daily.           . Cyanocobalamin (VITAMIN B-12 IJ)   Injection   Inject 1 Applicatorful as directed every 30 (thirty) days.         . hydrochlorothiazide (HYDRODIURIL) 25 MG tablet   Oral   Take 25 mg by mouth every morning.         . memantine (NAMENDA) 10 MG tablet   Oral   Take 1 tablet (10 mg total) by mouth 2 (two) times daily.   180 tablet   1   . Memantine HCl ER (NAMENDA XR) 28 MG CP24   Oral   Take 28 mg by mouth daily. If this medication is still on backorder, please dispense Namenda 10mg  BID #180 + 1 Refills instead.  Thank you.  90 capsule   1   . Multiple Vitamins-Minerals (CENTRUM SILVER ULTRA WOMENS PO)   Oral   Take 1 tablet by mouth every morning.          . nabumetone (RELAFEN) 500 MG tablet   Oral   Take 500 mg by mouth 2 (two) times daily.         . potassium chloride SA (K-DUR,KLOR-CON) 20 MEQ tablet   Oral   Take 20 mEq by mouth daily.         . QUEtiapine (SEROQUEL) 50 MG tablet      1 tab an, 1 after 2pm and 1 at night   270 tablet   1   . ramipril (ALTACE) 10 MG capsule      One tablet daily in the morning   30 capsule   0   . Rivastigmine (EXELON) 13.3 MG/24HR PT24   Transdermal   Place 1 patch (13.3 mg total) onto the skin daily.   90 patch   1   . sulfamethoxazole-trimethoprim (SEPTRA DS) 800-160 MG per tablet   Oral   Take 1 tablet by mouth  every 12 (twelve) hours.   6 tablet   0    BP 151/72  Pulse 53  Temp(Src) 97.4 F (36.3 C) (Oral)  Resp 13  SpO2 99% Physical Exam  Constitutional: She is oriented to person, place, and time. She appears well-developed and well-nourished. No distress.  HENT:  Head: Normocephalic and atraumatic.  Eyes: Pupils are equal, round, and reactive to light. Right eye exhibits no discharge. Left eye exhibits no discharge.  Neck: Normal range of motion.  Cardiovascular: Normal rate, regular rhythm and normal heart sounds.   Pulmonary/Chest: Effort normal and breath sounds normal.  Abdominal: Soft. She exhibits no distension. There is no tenderness.  Musculoskeletal: Normal range of motion.  Neurological: She is alert and oriented to person, place, and time.  Skin: Skin is warm. She is not diaphoretic.    ED Course  Procedures (including critical care time) Labs Review Labs Reviewed  BASIC METABOLIC PANEL - Abnormal; Notable for the following:    Potassium 3.6 (*)    Glucose, Bld 138 (*)    Creatinine, Ser 1.16 (*)    GFR calc non Af Amer 42 (*)    GFR calc Af Amer 49 (*)    All other components within normal limits  URINALYSIS, ROUTINE W REFLEX MICROSCOPIC - Abnormal; Notable for the following:    APPearance CLOUDY (*)    Leukocytes, UA LARGE (*)    All other components within normal limits  URINE MICROSCOPIC-ADD ON - Abnormal; Notable for the following:    Squamous Epithelial / LPF MANY (*)    Bacteria, UA MANY (*)    All other components within normal limits  URINE CULTURE  CBC  POCT I-STAT TROPONIN I   Imaging Review Ct Head Wo Contrast  12/23/2013   CLINICAL DATA:  Status post fall.  EXAM: CT HEAD WITHOUT CONTRAST  CT CERVICAL SPINE WITHOUT CONTRAST  TECHNIQUE: Multidetector CT imaging of the head and cervical spine was performed following the standard protocol without intravenous contrast. Multiplanar CT image reconstructions of the cervical spine were also generated.   COMPARISON:  None.  FINDINGS: CT HEAD FINDINGS  Contusion is seen over the right frontal bone without underlying fracture. There is no evidence of acute intracranial abnormality including infarction, hemorrhage, mass lesion, mass effect, midline shift or abnormal extra-axial fluid collection. Mild appearing chronic microvascular ischemic change  is noted.  CT CERVICAL SPINE FINDINGS  No fracture or malalignment of the cervical spine is identified. Degenerative disc disease most notable at C5-6. There is scattered facet arthropathy. Lung apices are clear. Paraspinous soft tissue structures are unremarkable.  IMPRESSION: Scout contusion over the right frontal bone without underlying acute intracranial abnormality.  No acute abnormality cervical spine. Degenerative disc disease C5-6.   Electronically Signed   By: Drusilla Kanner M.D.   On: 12/23/2013 22:07   Ct Cervical Spine Wo Contrast  12/23/2013   CLINICAL DATA:  Status post fall.  EXAM: CT HEAD WITHOUT CONTRAST  CT CERVICAL SPINE WITHOUT CONTRAST  TECHNIQUE: Multidetector CT imaging of the head and cervical spine was performed following the standard protocol without intravenous contrast. Multiplanar CT image reconstructions of the cervical spine were also generated.  COMPARISON:  None.  FINDINGS: CT HEAD FINDINGS  Contusion is seen over the right frontal bone without underlying fracture. There is no evidence of acute intracranial abnormality including infarction, hemorrhage, mass lesion, mass effect, midline shift or abnormal extra-axial fluid collection. Mild appearing chronic microvascular ischemic change is noted.  CT CERVICAL SPINE FINDINGS  No fracture or malalignment of the cervical spine is identified. Degenerative disc disease most notable at C5-6. There is scattered facet arthropathy. Lung apices are clear. Paraspinous soft tissue structures are unremarkable.  IMPRESSION: Scout contusion over the right frontal bone without underlying acute intracranial  abnormality.  No acute abnormality cervical spine. Degenerative disc disease C5-6.   Electronically Signed   By: Drusilla Kanner M.D.   On: 12/23/2013 22:07    EKG Interpretation   None     EKG demonstrates sinus rhythm, low voltage, precordial leads. HR 58, PR 187, QT/QTc 429/422.   MDM   1. Fall from standing, initial encounter   2. Facial hematoma, initial encounter   3. Bacteriuria    80 yo F who presents to hospital for fall from standing. Patient fell and hit the R side of her head, and has a hematoma to her R face, with no instability or significant tenderness. Patient alert, oriented x4. Patient states she doesn't remember feeling any symptoms, but went from sitting to standing at the restaurant, and the next thing she remembers is being helped up. Husband says that she dropped to the floor, hit her head, and got up quickly afterwards. No seizure like activity, no loss of bowel/bladder, no chest pain, nausea, vomiting, tingling, numbness.   Given age and comorbidities, will rule out acute pathology with basic labwork, EKG. Will obtain CT. Erroneous nursing error mentions patient on coumadin - this is not the case, the husband takes coumadin, not the patient. Still, given mechanism, will perform CT head to rule out acute bleed.   Urine obtained. Leuks with no nitrites. Will treat with short course of ATBs. Patient discharged to home with family with strict return precautions. Patient and family comfortable with plan, and will follow-up with PCP as needed. Discharged to home in stable condition. Patient seen and evaluated by myself and my attending, Dr. Wilkie Aye.      Imagene Sheller, MD 12/24/13 [phone]

## 2013-12-23 NOTE — ED Notes (Signed)
Pt. Fell in hallway. Almost blocked out. On coumadin. Large hematoma on rt. Side of head, from forehead down to lateral side of face. Abrasion on rt. Side of rt. Eye. No vision loss

## 2013-12-24 LAB — URINE MICROSCOPIC-ADD ON

## 2013-12-24 LAB — URINALYSIS, ROUTINE W REFLEX MICROSCOPIC
Bilirubin Urine: NEGATIVE
GLUCOSE, UA: NEGATIVE mg/dL
HGB URINE DIPSTICK: NEGATIVE
Ketones, ur: NEGATIVE mg/dL
Nitrite: NEGATIVE
PROTEIN: NEGATIVE mg/dL
Specific Gravity, Urine: 1.012 (ref 1.005–1.030)
Urobilinogen, UA: 1 mg/dL (ref 0.0–1.0)
pH: 5 (ref 5.0–8.0)

## 2013-12-24 MED ORDER — SULFAMETHOXAZOLE-TRIMETHOPRIM 800-160 MG PO TABS: 1.0000 | ORAL_TABLET | Freq: Two times a day (BID) | ORAL | Status: DC

## 2013-12-25 LAB — URINE CULTURE

## 2013-12-25 NOTE — ED Provider Notes (Signed)
I saw and evaluated the patient, reviewed the resident's note and I agree with the findings and plan.  EKG Interpretation    Date/Time:  Friday December 23 2013 21:19:25 EST Ventricular Rate:  58 PR Interval:  187 QRS Duration: 94 QT Interval:  429 QTC Calculation: 421 R Axis:   7 Text Interpretation:  Sinus rhythm Low voltage, precordial leads ED PHYSICIAN INTERPRETATION AVAILABLE IN [facility] Confirmed by TEST, RECORD ([id number]) on 12/25/2013 11:19:30 AM           EKG interpretable in MUSE. I agree with resident's interpretation of EKG.  This is an 80 year old female who presents following a fall.  Patient is unsure how she fell and states that she just remembers walking and next getting up off the floor. She's not currently on anticoagulants. She has evidence of facial trauma. Basic labwork was obtained and screening EKG were obtained. These are reassuring. CT head and neck are negative. Patient does have large leuks and bacteria in her urine. We'll treat her for UTI. Patient was ambulatory prior to discharge.  Discuss with family plan for discharge with close followup with PCP. They state understanding.    After history, exam, and medical workup I feel the patient has been appropriately medically screened and is safe for discharge home. Pertinent diagnoses were discussed with the patient. Patient was given return precautions.   Shon Batonourtney F Horton, MD 12/25/13 [phone]

## 2013-12-27 ENCOUNTER — Encounter: Payer: Self-pay | Admitting: Family Medicine

## 2013-12-27 ENCOUNTER — Ambulatory Visit (INDEPENDENT_AMBULATORY_CARE_PROVIDER_SITE_OTHER): Payer: Medicare Other | Admitting: Family Medicine

## 2013-12-27 VITALS — BP 120/60

## 2013-12-27 DIAGNOSIS — IMO0002 Reserved for concepts with insufficient information to code with codable children: Secondary | ICD-10-CM

## 2013-12-27 DIAGNOSIS — S0510XA Contusion of eyeball and orbital tissues, unspecified eye, initial encounter: Secondary | ICD-10-CM

## 2013-12-27 DIAGNOSIS — S058X1A Other injuries of right eye and orbit, initial encounter: Secondary | ICD-10-CM | POA: Insufficient documentation

## 2013-12-27 DIAGNOSIS — S058X2A Other injuries of left eye and orbit, initial encounter: Secondary | ICD-10-CM

## 2013-12-27 MED ORDER — RAMIPRIL 5 MG PO CAPS: 5.0000 mg | ORAL_CAPSULE | Freq: Every day | ORAL | Status: DC

## 2013-12-27 NOTE — Progress Notes (Signed)
   Subjective:    Patient ID: [name], female    DOB: 1928/09/20, 80 y.o.   MRN: [id number]  HPI [name] is a 80 year old female who comes in today accompanied by her daughter [name] for evaluation of a fall  On January 10 they were at a restaurant and mama got up and tripped and fell on her face. She also bruised her right forearm. She was taken to the emergency room and had the obligatory CT scan which of course was normal. No loss of consciousness no neurologic deficits. For whatever reason they did a urinalysis which showed some bacteria however she was asymptomatic and urinalysis also showed epithelial cells however she was given Septra   Review of Systems Review of systems otherwise negative    Objective:   Physical Exam Well-developed well-nourished female no acute distress she has obvious black eyes right and left. Parenthetically she is complaining of blurred vision in the right eye.  BP 120/60 in the sitting position. She's been on Altace 10 mg daily.       Assessment & Plan:  Contusion to the face  Hypertension........ BP is too low 120/60........ decrease BP medication from 10 mg daily to 5 followup in 2 weeks  Blurred vision right eye refer to ophthalmology

## 2013-12-27 NOTE — Patient Instructions (Signed)
Tylenol,,,,,, 2 tabs 3 times day when necessary for headache  Hold the Altace tomorrow  Restart it on Wednesday at 5 mg daily  Check her blood pressure daily in the morning  Ideal blood pressure for her would be 140/90  Followup in 2 weeks  Call Dr. Hazle Quantigby,,,,,,,,,,,,,,, for consult concerning the blurred vision in her right eye  She must use a 4. walker continuously

## 2013-12-29 ENCOUNTER — Other Ambulatory Visit: Payer: Self-pay | Admitting: Family Medicine

## 2014-01-10 ENCOUNTER — Encounter: Payer: Self-pay | Admitting: Family Medicine

## 2014-01-10 ENCOUNTER — Ambulatory Visit (INDEPENDENT_AMBULATORY_CARE_PROVIDER_SITE_OTHER): Payer: Medicare Other | Admitting: Family Medicine

## 2014-01-10 VITALS — BP 110/70 | HR 65 | Temp 97.7°F

## 2014-01-10 DIAGNOSIS — S058X1A Other injuries of right eye and orbit, initial encounter: Secondary | ICD-10-CM

## 2014-01-10 DIAGNOSIS — S0510XA Contusion of eyeball and orbital tissues, unspecified eye, initial encounter: Secondary | ICD-10-CM

## 2014-01-10 DIAGNOSIS — IMO0002 Reserved for concepts with insufficient information to code with codable children: Secondary | ICD-10-CM

## 2014-01-10 DIAGNOSIS — S058X2A Other injuries of left eye and orbit, initial encounter: Secondary | ICD-10-CM

## 2014-01-10 NOTE — Patient Instructions (Signed)
Stop the Altace completely  Check a blood pressure daily in the morning  Followup in one month

## 2014-01-10 NOTE — Progress Notes (Signed)
Pre visit review using our clinic review tool, if applicable. No additional management support is needed unless otherwise documented below in the visit note. 

## 2014-01-10 NOTE — Progress Notes (Signed)
   Subjective:    Patient ID: [name], female    DOB: [birth year], 80 y.o.   MRN: [id number]  HPI [name] is a 80 year old female who comes in today accompanied by her husband for evaluation of a fall secondary to hypertension  She'll follow home and developed a contusion of both upper eyelids with bilateral black eyes the couple weeks ago. We cut her Altase from 5 mg to 2.5 mg daily. BP today 110/70 still too low   Review of Systems Views systems negative except she still lightheaded when she stands up    Objective:   Physical Exam Well-developed well-nourished female no acute distress vital signs stable BP 110/70. She's got bilateral black eyes resolving       Assessment & Plan:  Fall secondary to hypertension BP still too low DC Altace

## 2014-01-18 ENCOUNTER — Telehealth: Payer: Self-pay | Admitting: Family Medicine

## 2014-01-18 NOTE — Telephone Encounter (Signed)
Relevant patient education mailed to patient.  

## 2014-01-19 ENCOUNTER — Telehealth: Payer: Self-pay | Admitting: Family Medicine

## 2014-01-19 NOTE — Telephone Encounter (Signed)
Relevant patient education mailed to patient.  

## 2014-01-29 ENCOUNTER — Other Ambulatory Visit: Payer: Self-pay | Admitting: Neurology

## 2014-01-30 ENCOUNTER — Other Ambulatory Visit: Payer: Self-pay

## 2014-01-30 MED ORDER — MEMANTINE HCL ER 28 MG PO CP24: 28.0000 mg | ORAL_CAPSULE | Freq: Every day | ORAL | Status: DC

## 2014-02-04 ENCOUNTER — Other Ambulatory Visit: Payer: Self-pay | Admitting: Nurse Practitioner

## 2014-02-08 ENCOUNTER — Other Ambulatory Visit: Payer: Self-pay | Admitting: Nurse Practitioner

## 2014-02-10 ENCOUNTER — Encounter: Payer: Self-pay | Admitting: Nurse Practitioner

## 2014-02-10 ENCOUNTER — Ambulatory Visit (INDEPENDENT_AMBULATORY_CARE_PROVIDER_SITE_OTHER): Payer: Medicare Other | Admitting: Nurse Practitioner

## 2014-02-10 ENCOUNTER — Encounter (INDEPENDENT_AMBULATORY_CARE_PROVIDER_SITE_OTHER): Payer: Self-pay

## 2014-02-10 VITALS — BP 139/79 | HR 60

## 2014-02-10 DIAGNOSIS — R413 Other amnesia: Secondary | ICD-10-CM

## 2014-02-10 DIAGNOSIS — G309 Alzheimer's disease, unspecified: Principal | ICD-10-CM

## 2014-02-10 DIAGNOSIS — F0280 Dementia in other diseases classified elsewhere without behavioral disturbance: Secondary | ICD-10-CM

## 2014-02-10 NOTE — Progress Notes (Signed)
[facility]  PATIENT: [name] DOB: Aug 19, 1928   REASON FOR VISIT: Follow up for Alzheimer's disease   HISTORY OF PRESENT ILLNESS: [name], 80 year old female returns for followup. She has a history of Alzheimer's dementia. She is currently on Exelon patch and Namenda without side effects. Memory score is stable today at 23/30. Memory is about the same per her husband, she continues to live in her private home. She no longer cooks or does the shopping she can dress and feed herself as well as taking care of her toileting needs. Seroquel has helped her agitation and outburst. Things appear to be stable at this time. She returns for reevaluation    HISTORY: progressive memory loss, likely Alzheimer's dementia returns for followup.  Remains on Exelon patch, and Namenda 10 twice daily. Memory score is stable. Seroquel has helped her agitation and behavioral outburst. She can still dress and feed herself as well as take care of her toileting needs. She no longer cooks and they eat out a lot. She denies side effects to her medications  01/11/13: Returns for followup. Memory about the same per husband. Currently on Exelon and Namenda without side effects. Takes Seroquel for agitation and behaviors that has been beneficial. Continues to live in private home.  She was started on Aricept in 2010 and has tolerated that without side effect. She has had worsening behavior and has had episodes of being agitated, belligerent, accusatory, irritable and argumentative, particularly with her husband. She gets upset when she cannot find things, and accused family members of taking the missing objects. She has no interest in the same activities. She no longer cooks or does any shopping. She and her husband tend to go out to eat or eat simply prepared foods brought by their daughters. She has been bathing washing herself with a damp washcloths. She can still dress and feed herself as well as  taking care of toileting hygiene. She has had a good appetite and sleeps well.  Namenda was begun 1.5 yrs ago. Exelon was started in Nov, 2011, and Aricept was discontinued. She was also started on Seroquel and that has helped with her agitation and behavior outbursts that are mostly directed at husband. The plan is to keep her at home as long as possible. Has good and bad days but things appear stable.    REVIEW OF SYSTEMS: Full 14 system review of systems performed and notable only for those listed, all others are neg:  Constitutional: N/A  Cardiovascular: N/A  Ear/Nose/Throat: N/A  Skin: N/A  Eyes: N/A  Respiratory: N/A  Gastroitestinal: N/A  Hematology/Lymphatic: N/A  Endocrine: N/A Musculoskeletal:N/A  Allergy/Immunology: N/A  Neurological: memory loss Psychiatric: N/A   ALLERGIES: No Known Allergies  HOME MEDICATIONS: Outpatient Prescriptions Prior to Visit  Medication Sig Dispense Refill  . aspirin (ADULT ASPIRIN EC LOW STRENGTH) 81 MG EC tablet Take 81 mg by mouth daily.        Marland Kitchen. atorvastatin (LIPITOR) 80 MG tablet Take 80 mg by mouth at bedtime.      . Calcium-Vitamin D 600-125 MG-UNIT TABS Take 1 tablet by mouth daily.        . Cyanocobalamin (VITAMIN B-12 IJ) Inject 1 Applicatorful as directed every 30 (thirty) days.      . EXELON 13.3 MG/24HR PT24 APPLY 1 PATCH ONTO THE SKIN DAILY  90 patch  1  . hydrochlorothiazide (HYDRODIURIL) 25 MG tablet Take 25 mg by mouth every morning.      .Marland Kitchen  Memantine HCl ER (NAMENDA XR) 28 MG CP24 Take 28 mg by mouth daily.  90 capsule  1  . Multiple Vitamins-Minerals (CENTRUM SILVER ULTRA WOMENS PO) Take 1 tablet by mouth every morning.       . nabumetone (RELAFEN) 500 MG tablet Take 500 mg by mouth 2 (two) times daily.      . potassium chloride SA (K-DUR,KLOR-CON) 20 MEQ tablet TAKE 1 TABLET DAILY  90 tablet  3  . QUEtiapine (SEROQUEL) 50 MG tablet TAKE 1 TABLET IN THE MORNING, 1 TABLET AFTER 2 P.M. AND 1 TABLET AT NIGHT  90 tablet  0  .  ramipril (ALTACE) 5 MG capsule Take 1 capsule (5 mg total) by mouth daily.  90 capsule  3  . sulfamethoxazole-trimethoprim (SEPTRA DS) 800-160 MG per tablet Take 1 tablet by mouth every 12 (twelve) hours.  6 tablet  0   No facility-administered medications prior to visit.    PAST MEDICAL HISTORY: Past Medical History  Diagnosis Date  . Coronary atherosclerosis of unspecified type of vessel, native or graft     nonobstructive; unspecified site  . Complications affecting other specified body systems, hypertension   . Other and unspecified hyperlipidemia   . Other B-complex deficiencies   . Localized osteoarthrosis not specified whether primary or secondary, unspecified site   . Memory loss   . Unspecified essential hypertension   . Arthritis     PAST SURGICAL HISTORY: Past Surgical History  Procedure Laterality Date  . Cardiac catheterization  1992    real a 20% lesion in the LAD    FAMILY HISTORY: History reviewed. No pertinent family history.  SOCIAL HISTORY: History   Social History  . Marital Status: Married    Spouse Name: Leonette Most     Number of Children: 4  . Years of Education: 12   Occupational History  . Retired    Social History Main Topics  . Smoking status: Never Smoker   . Smokeless tobacco: Never Used  . Alcohol Use: No  . Drug Use: No  . Sexual Activity: Not on file   Other Topics Concern  . Not on file   Social History Narrative   Patient lives at home with husband Leonette Most.   Patient is retired.    Patient is right handed.    Patient is a high school grad.            PHYSICAL EXAM  Filed Vitals:   02/10/14 1413  BP: 139/79  Pulse: 60   Cannot calculate BMI with a height equal to zero.  Generalized: Well developed, in no acute distress  Head: normocephalic and atraumatic,. Oropharynx benign  Neck: Supple, no carotid bruits  Cardiac: Regular rate rhythm, no murmur  Skin  peripheral edema mild in the ankles and lower legs right  greater than left  Neurological examination   Mentation: Alert MMSE 23/30. AFT 9. Follows all commands speech and language fluent  Cranial nerve II-XII: Pupils were equal round reactive to light extraocular movements were full, visual field were full on confrontational test. Facial sensation and strength were normal. hearing was intact to finger rubbing bilaterally. Uvula tongue midline. head turning and shoulder shrug were normal and symmetric.Tongue protrusion into cheek strength was normal. Motor: normal bulk and tone, full strength in the BUE, BLE,  No focal weakness Coordination: finger-nose-finger,  no dysmetria Reflexes: Diminished and symmetric except for right knee and ankle are absent. Gait and Station: Deferred patient is in a wheelchair   DIAGNOSTIC  DATA (LABS, IMAGING, TESTING) - I reviewed patient records, labs, notes, testing and imaging myself where available.  Lab Results  Component Value Date   WBC 4.8 12/23/2013   HGB 12.5 12/23/2013   HCT 37.5 12/23/2013   MCV 94.0 12/23/2013   PLT 208 12/23/2013      Component Value Date/Time   NA 140 12/23/2013 2139   K 3.6* 12/23/2013 2139   CL 101 12/23/2013 2139   CO2 25 12/23/2013 2139   GLUCOSE 138* 12/23/2013 2139   GLUCOSE 110* 10/19/2006 0813   BUN 21 12/23/2013 2139   CREATININE 1.16* 12/23/2013 2139   CALCIUM 8.8 12/23/2013 2139   PROT 6.7 08/05/2011 0916   ALBUMIN 3.6 08/05/2011 0916   AST 20 08/05/2011 0916   ALT 15 08/05/2011 0916   ALKPHOS 135* 08/05/2011 0916   BILITOT 1.3* 08/05/2011 0916   GFRNONAA 42* 12/23/2013 2139   GFRAA 49* 12/23/2013 2139       ASSESSMENT AND PLAN  80 y.o. year old female  has a past medical history of progressive memory loss probable Alzheimer's dementia   Continue Exelon patch,  does not need refills Continue Namenda 28 mg daily, did not need refills Continue Seroquel as directed Memory score is stable Followup in 6 months Nilda Riggs, [facility], [facility], APRN  [facility] [address] [place], [place] [address] [phone]

## 2014-02-10 NOTE — Patient Instructions (Signed)
Continue Exelon patch, but does not need refills Continue Namenda 28 mg daily, did not need refills Continue Seroquel as directed Memory score is stable Followup in 6 months

## 2014-02-10 NOTE — Progress Notes (Signed)
I agree with the assessment and plan as directed by NP .The patient is known to me .   Kenzleigh Sedam, MD  

## 2014-02-14 ENCOUNTER — Ambulatory Visit: Payer: Medicare Other | Admitting: Family Medicine

## 2014-02-23 ENCOUNTER — Ambulatory Visit (INDEPENDENT_AMBULATORY_CARE_PROVIDER_SITE_OTHER): Payer: Medicare Other | Admitting: Family Medicine

## 2014-02-23 ENCOUNTER — Encounter: Payer: Self-pay | Admitting: Family Medicine

## 2014-02-23 VITALS — BP 150/76 | HR 72 | Temp 97.8°F | Resp 20 | Ht 64.0 in | Wt 223.0 lb

## 2014-02-23 DIAGNOSIS — G309 Alzheimer's disease, unspecified: Principal | ICD-10-CM

## 2014-02-23 DIAGNOSIS — F0280 Dementia in other diseases classified elsewhere without behavioral disturbance: Secondary | ICD-10-CM

## 2014-02-23 DIAGNOSIS — R413 Other amnesia: Secondary | ICD-10-CM

## 2014-02-23 DIAGNOSIS — I2510 Atherosclerotic heart disease of native coronary artery without angina pectoris: Secondary | ICD-10-CM

## 2014-02-23 DIAGNOSIS — E538 Deficiency of other specified B group vitamins: Secondary | ICD-10-CM

## 2014-02-23 MED ORDER — CYANOCOBALAMIN 1000 MCG/ML IJ SOLN: 1000.0000 ug | Freq: Once | INTRAMUSCULAR | Status: DC

## 2014-02-23 NOTE — Patient Instructions (Signed)
Vitamin B12.........Marland Kitchen. 1 cc monthly  Put the cane in the closet.........  4. walker continuously  Return in 3 months for followup sooner if any problems  Stop the Lipitor

## 2014-02-23 NOTE — Progress Notes (Signed)
   Subjective:    Patient ID: Asencion PartridgeWilda H Cottone, female    DOB: 21-Nov-1928, 80 y.o.   MRN: [id number]  HPI [name] is a 80 year old married female nonsmoker who comes in today accompanied by her husband who is her primary caregiver. She has underlying Alzheimer's dementia and is on a new Mendy a 28 mg and Exelon 13.3 mg patch daily  Overall her husband says she seems to be doing well.  He's also taking Lipitor 80 mg daily because of a history of underlying coronary artery disease however with the recent reports of statins causing possible memory loss I think I would stop her statin  She is not ambulatory except with a cane and mostly she uses a wheelchair. She has severe degenerative joint disease in her knees and takes Aleve one twice daily. In the past we've discussed a 4. walker but she refuses to use it.  They've been out of the B12 for 1 month. She has a history of pernicious anemia.   Review of Systems Review of systems otherwise negative    Objective:   Physical Exam  Well-developed well nourished female in acute distress vital signs stable she is afebrile      Assessment & Plan:  Alzheimer's dementia continue current therapy  Degenerative joint disease...Marland Kitchen.Marland Kitchen.Marland Kitchen. continue Aleve one twice daily 4. walker continuously with the cane in the closet  Refill B12.

## 2014-02-23 NOTE — Progress Notes (Signed)
Pre-visit discussion using our clinic review tool. No additional management support is needed unless otherwise documented below in the visit note.  

## 2014-03-09 ENCOUNTER — Other Ambulatory Visit: Payer: Self-pay | Admitting: Family Medicine

## 2014-03-14 ENCOUNTER — Telehealth: Payer: Self-pay | Admitting: Family Medicine

## 2014-03-14 DIAGNOSIS — E538 Deficiency of other specified B group vitamins: Secondary | ICD-10-CM

## 2014-03-14 MED ORDER — CYANOCOBALAMIN 1000 MCG/ML IJ SOLN: 1000.0000 ug | Freq: Once | INTRAMUSCULAR | Status: DC

## 2014-03-14 NOTE — Telephone Encounter (Signed)
Rx sent 

## 2014-03-14 NOTE — Telephone Encounter (Signed)
Patient's husband came in with a box of syringes and states that they didn't receive his wife's B12 meds - only the syringes. The prescription came through mail order [facility]. Please make a prescription for the B12 injection. I verified the contact number. Thanks!

## 2014-04-18 ENCOUNTER — Telehealth: Payer: Self-pay | Admitting: Family Medicine

## 2014-04-18 MED ORDER — HYDROCHLOROTHIAZIDE 25 MG PO TABS: 25.0000 mg | ORAL_TABLET | ORAL | Status: DC

## 2014-04-18 NOTE — Telephone Encounter (Signed)
Pt is needing new rx for hydrochlorothiazide (HYDRODIURIL) 25 MG tablet, [facility] to [facility]

## 2014-05-20 ENCOUNTER — Other Ambulatory Visit: Payer: Self-pay | Admitting: Family Medicine

## 2014-05-24 ENCOUNTER — Telehealth: Payer: Self-pay | Admitting: Neurology
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
## 2014-05-25 NOTE — Telephone Encounter (Signed)
I tried home # and was not able to [facility] called Charlene back and relayed that do not have them on HIPPA and that  I tried home # and was not able to contact husband of pt.  If POA is husband he can come sign form for [place] to speak to them. I would not be available tomorrow but triage pool would be here.
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
## 2014-05-30 NOTE — Telephone Encounter (Signed)
I called and asked to speak to husband will return call.

## 2014-05-31 NOTE — Telephone Encounter (Signed)
Charlene returned Sandy's call, would like for [place] to return her call. Thanks

## 2014-05-31 NOTE — Telephone Encounter (Signed)
Daughters of pt now on HIPPA.  Will forward message to Dr. Vickey Hugerohmeier since [place] out of office.  Has f/u appt 08-16-14 with Dr.D.  If sooner appt needed call one daughter and let them know as well.

## 2014-05-31 NOTE — Telephone Encounter (Signed)
  Porfirio Mylararmen Dohmeier:   Have patient see  NP Aundra MilletMegan as soon as possible and i will come into the room and assist.  This may be a placement issue. Mr Yetta BarreJones indicated his wife is "most confused' and he needs an afternoon appointment to bring her in.   DOHMEIER,CARMEN, MD

## 2014-05-31 NOTE — Telephone Encounter (Signed)
Patient's husband scheduled an appointment for tomorrow with Aundra MilletMegan and the patient's daughter was also called and informed of the appointment.

## 2014-06-01 ENCOUNTER — Ambulatory Visit: Payer: Medicare Other | Admitting: Family Medicine

## 2014-06-01 ENCOUNTER — Ambulatory Visit (INDEPENDENT_AMBULATORY_CARE_PROVIDER_SITE_OTHER): Payer: Medicare Other | Admitting: Adult Health

## 2014-06-01 ENCOUNTER — Encounter (INDEPENDENT_AMBULATORY_CARE_PROVIDER_SITE_OTHER): Payer: Self-pay

## 2014-06-01 ENCOUNTER — Encounter: Payer: Self-pay | Admitting: Adult Health

## 2014-06-01 VITALS — BP 153/80 | HR 63

## 2014-06-01 DIAGNOSIS — F0280 Dementia in other diseases classified elsewhere without behavioral disturbance: Secondary | ICD-10-CM

## 2014-06-01 DIAGNOSIS — G309 Alzheimer's disease, unspecified: Principal | ICD-10-CM

## 2014-06-01 DIAGNOSIS — R269 Unspecified abnormalities of gait and mobility: Secondary | ICD-10-CM

## 2014-06-01 DIAGNOSIS — I2510 Atherosclerotic heart disease of native coronary artery without angina pectoris: Secondary | ICD-10-CM

## 2014-06-01 NOTE — Patient Instructions (Addendum)
PACE PROGRAM: [phone]) [phone]   Alzheimer Disease Alzheimer's Disease is a mental disorder. It causes memory loss and loss of other mental functions, such as learning, thinking, solving problems, communicating, and completing tasks. The mental losses interfere with the ability to perform daily activities at work, at home, or in social situations. Alzheimer's Disease usually starts in a person's late 27s or early 64s but can start earlier in life (familial form). The mental changes caused by this disease are permanent and worsen over time. As the illness progresses, the ability to do even the simplest things is lost. Survival with Alzheimer's Disease ranges from several years to as long as 20 years. CAUSES Alzheimer's Disease is caused by abnormally high levels of a protein (beta-amyloid) in the brain. This protein forms very small deposits within and around the brain's nerve cells. These deposits prevent the nerve cells from working properly. Experts are not certain what causes the beta-amyloid deposits in this disease. RISK FACTORS The following major risk factors have been identified:  Increasing age.  Certain genetic variations, such as Down syndrome (trisomy 21). SYMPTOMS In the early stages of Alzheimer's Disease, you are still able to perform daily activities but need greater effort, more time, or memory aids. Early symptoms include:  Mild memory loss of recent events, names, or phone numbers.  Loss of objects.  Minor loss of vocabulary.  Difficulty with complex tasks, such as paying bills or driving in unfamiliar locations. Other mental functions deteriorate as the disease worsens. These changes slowly go from mild to severe. Symptoms at this stage include:  Difficulty remembering. You may not be able to recall personal information such as your address and telephone number. You may become confused about the date, the season of the year, or your location.  Difficulty maintaining  attention. You may forget what you wanted to say during conversations and repeat what you have already said.  Difficulty learning new information or tasks. You may not remember what you read or the name of a new friend you met.  Difficulty counting or doing math. You may have difficulty with complex math problems. You may make mistakes in paying bills or managing your checkbook.  Poor reasoning and judgment. You may make poor decisions or not dress right for the weather.  Difficulty communicating. You may have regular difficulty remembering words, naming objects, expressing yourself clearly, or writing sentences that make sense.  Difficulty performing familiar daily activities. You may get lost driving in familiar locations or need help eating, bathing, dressing, grooming, or using the toilet. You may have difficulty maintaining bladder or bowel control.  Difficulty recognizing familiar faces. You may confuse family members or close friends with one another. You may not recognize a close relative or may mistake strangers for family. Alzheimer's Disease also may cause changes in personality and behavior. These changes include:   Loss of interest or motivation.  Social withdrawal.  Anxiety.  Difficulty sleeping.  Uncharacteristic anger or combativeness.  A false belief that someone is trying to harm you (paranoia).  Seeing things that are not real (hallucinations).  Agitation. Confusion and disruptive behavior are often worse at night and may be triggered by changes in the environment or acute medical issues. DIAGNOSIS  Alzheimer's Disease is diagnosed through an assessment by your health care provider. During this assessment, your health care provider will do the following:  Ask you and your family, friends, or caregiver questions about your symptoms, their frequency, their duration and progression, and the effect they  are having on your life.  Ask questions about your personal and  family medical history and use of alcohol or drugs, including prescription medicine.  Perform a physical exam and order blood tests and brain imaging exams. Your health care provider may refer you to a specialist for detailed evaluation of your mental functions (neuropsychological testing).  Many different brain disorders, medical conditions, and certain substances can cause symptoms that resemble Alzheimer's Disease symptoms. These must be ruled out before this disease can be diagnosed. If Alzheimer's Disease is diagnosed, it will be considered either "possible" or "probable" Alzheimer's Disease. "Possible" Alzheimer's Disease means that your symptoms are typical of the disease and no other disorder is causing them. "Probable" Alzheimer's Disease means that you also have a family history of the disease or genetic test results that support the diagnosis. Certain tests, mostly used in research studies, are highly specific for Alzheimer's Disease.  TREATMENT  There is currently no cure for this disease. The goals of treatment are to:  Slow down the progression of the disease.  Preserve mental function as long as possible.  Manage behavioral symptoms.  Make life easier for the person with Alzheimer's Disease and his or her caregivers. The following treatment options are available:  Medicine. Certain medicines may help slow memory loss by changing the level of certain chemicals in the brain. Medicine may also help with behavioral symptoms.  Talk therapy. Talk therapy provides education, support, and memory aids for people with this disease. It is most effective in the early stages of the illness.  Caregiving. Caregivers may be family members, friends, or trained medical professionals. They help the person with Alzheimer's Disease with daily life activities. Caregiving may take place at home or at a nursing facility.  Family support groups. These provide education, emotional support, and  information about community resources to family members who are taking care of the person with this disease. Document Released: 08/12/2004 Document Revised: 12/06/2013 Document Reviewed: 04/08/2013 [facility] Patient Information 2015 [place], [place]. This information is not intended to replace advice given to you by your health care provider. Make sure you discuss any questions you have with your health care provider.

## 2014-06-01 NOTE — Progress Notes (Signed)
I agree with the assessment and plan as directed by NP .The patient is known to me .   DOHMEIER,CARMEN, MD  
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
## 2014-06-05 ENCOUNTER — Ambulatory Visit (INDEPENDENT_AMBULATORY_CARE_PROVIDER_SITE_OTHER): Payer: Medicare Other | Admitting: Family Medicine

## 2014-06-05 ENCOUNTER — Encounter: Payer: Self-pay | Admitting: Family Medicine

## 2014-06-05 VITALS — BP 110/80 | Temp 98.0°F

## 2014-06-05 DIAGNOSIS — G309 Alzheimer's disease, unspecified: Principal | ICD-10-CM

## 2014-06-05 DIAGNOSIS — F0280 Dementia in other diseases classified elsewhere without behavioral disturbance: Secondary | ICD-10-CM

## 2014-06-05 DIAGNOSIS — R413 Other amnesia: Secondary | ICD-10-CM

## 2014-06-05 DIAGNOSIS — IMO0002 Reserved for concepts with insufficient information to code with codable children: Secondary | ICD-10-CM

## 2014-06-05 DIAGNOSIS — E538 Deficiency of other specified B group vitamins: Secondary | ICD-10-CM

## 2014-06-05 DIAGNOSIS — I2510 Atherosclerotic heart disease of native coronary artery without angina pectoris: Secondary | ICD-10-CM

## 2014-06-05 LAB — BASIC METABOLIC PANEL
BUN: 16 mg/dL (ref 6–23)
CHLORIDE: 101 meq/L (ref 96–112)
CO2: 29 meq/L (ref 19–32)
Calcium: 8.8 mg/dL (ref 8.4–10.5)
Creatinine, Ser: 1.1 mg/dL (ref 0.4–1.2)
GFR: 53.03 mL/min — ABNORMAL LOW (ref >60.00)
Glucose, Bld: 157 mg/dL — ABNORMAL HIGH (ref 70–99)
Potassium: 2.9 mEq/L — ABNORMAL LOW (ref 3.5–5.1)
Sodium: 138 mEq/L (ref 135–145)

## 2014-06-05 LAB — TSH: TSH: 1.16 u[IU]/mL (ref 0.35–4.50)

## 2014-06-05 MED ORDER — POTASSIUM CHLORIDE CRYS ER 20 MEQ PO TBCR: EXTENDED_RELEASE_TABLET | ORAL | Status: DC

## 2014-06-05 MED ORDER — MEMANTINE HCL ER 28 MG PO CP24: 28.0000 mg | ORAL_CAPSULE | Freq: Every day | ORAL | Status: DC

## 2014-06-05 MED ORDER — HYDROCHLOROTHIAZIDE 25 MG PO TABS: 25.0000 mg | ORAL_TABLET | ORAL | Status: DC

## 2014-06-05 MED ORDER — RIVASTIGMINE 13.3 MG/24HR TD PT24: MEDICATED_PATCH | TRANSDERMAL | Status: DC

## 2014-06-05 MED ORDER — QUETIAPINE FUMARATE 50 MG PO TABS: ORAL_TABLET | ORAL | Status: DC

## 2014-06-05 NOTE — Progress Notes (Signed)
   Subjective:    Patient ID: [name], female    DOB: 23-Jun-1928, 80 y.o.   MRN: [id number]  HPI Nancy GreenhouseWilda is a 80 year old married female nonsmoker with progressive dementia who is in a wheelchair and accompanied by her husband who is her primary caregiver.  He says overall she seems to be fairly stable. The Seroquel has helped a lot with sleep and mood. She takes one 3 times daily.  In 1992 she had a cardiac cath which showed a 20% LAD lesion. Since that time she's been on Lipitor. Has a questionable memory issues were addressed stop the.  I think she needs to continue B12 injections Exelon Namenda hydrochlorothiazide and potassium supplement.   Review of Systems Review of systems otherwise negative total care by has an    Objective:   Physical Exam Well-developed well-nourished female in a wheelchair no acute distress vital signs stable she's afebrile BP 110/80,,,,,,,,,,,,, states she's not lightheaded when she stands up       Assessment & Plan:  Dementia continue current medication  Vitamin B12 deficiency continue vitamin B12 injections monthly  Mild hypertension continue diuretic and potassium severe memory loss related to dementia,,,,,,,,,,,,, total care by husband

## 2014-06-05 NOTE — Patient Instructions (Signed)
Continue your current medications  Followup in 1 year sooner if any problems  Remember to get your flu shot in the fall

## 2014-06-05 NOTE — Progress Notes (Signed)
Pre visit review using our clinic review tool, if applicable. No additional management support is needed unless otherwise documented below in the visit note. 

## 2014-06-06 ENCOUNTER — Other Ambulatory Visit: Payer: Self-pay | Admitting: *Deleted

## 2014-06-06 MED ORDER — TRIAMTERENE-HCTZ 37.5-25 MG PO CAPS: 1.0000 | ORAL_CAPSULE | Freq: Every day | ORAL | Status: DC

## 2014-06-07 ENCOUNTER — Ambulatory Visit: Payer: Medicare Other | Admitting: Rehabilitative and Restorative Service Providers"

## 2014-06-12 ENCOUNTER — Ambulatory Visit: Payer: Medicare Other

## 2014-06-13 ENCOUNTER — Ambulatory Visit
Admission: RE | Admit: 2014-06-13 | Discharge: 2014-06-13 | Disposition: A | Discharge status: Home / Self Care | Payer: Medicare Other | Source: Ambulatory Visit | Attending: Adult Health | Admitting: Adult Health

## 2014-06-13 DIAGNOSIS — F0280 Dementia in other diseases classified elsewhere without behavioral disturbance: Secondary | ICD-10-CM

## 2014-06-13 DIAGNOSIS — G309 Alzheimer's disease, unspecified: Secondary | ICD-10-CM

## 2014-06-14 ENCOUNTER — Telehealth: Payer: Self-pay | Admitting: Adult Health

## 2014-06-14 NOTE — Telephone Encounter (Signed)
I spoke to the husband regarding MRI results. I explained that there were no acute findings that would offer an explanation for the increase in agitation. Patient did have some atrophy in the temporal region that could be consistent with Alzheimer's disease. The patient also had some mild to moderate chronic small vessel ischemic disease. Husband verbalized understanding.

## 2014-06-20 ENCOUNTER — Ambulatory Visit: Payer: Medicare Other | Attending: Family Medicine | Admitting: Physical Therapy

## 2014-06-20 DIAGNOSIS — R269 Unspecified abnormalities of gait and mobility: Secondary | ICD-10-CM | POA: Diagnosis not present

## 2014-06-20 DIAGNOSIS — R5381 Other malaise: Secondary | ICD-10-CM | POA: Diagnosis not present

## 2014-06-20 DIAGNOSIS — F03918 Unspecified dementia, unspecified severity, with other behavioral disturbance: Secondary | ICD-10-CM | POA: Insufficient documentation

## 2014-06-20 DIAGNOSIS — I2510 Atherosclerotic heart disease of native coronary artery without angina pectoris: Secondary | ICD-10-CM | POA: Insufficient documentation

## 2014-06-20 DIAGNOSIS — E538 Deficiency of other specified B group vitamins: Secondary | ICD-10-CM | POA: Insufficient documentation

## 2014-06-20 DIAGNOSIS — IMO0001 Reserved for inherently not codable concepts without codable children: Secondary | ICD-10-CM | POA: Diagnosis not present

## 2014-06-20 DIAGNOSIS — F0391 Unspecified dementia with behavioral disturbance: Secondary | ICD-10-CM | POA: Insufficient documentation

## 2014-06-20 DIAGNOSIS — I10 Essential (primary) hypertension: Secondary | ICD-10-CM | POA: Diagnosis not present

## 2014-06-21 ENCOUNTER — Other Ambulatory Visit: Payer: Self-pay | Admitting: *Deleted

## 2014-06-21 ENCOUNTER — Other Ambulatory Visit: Payer: Medicare Other

## 2014-06-21 DIAGNOSIS — I10 Essential (primary) hypertension: Secondary | ICD-10-CM

## 2014-06-22 ENCOUNTER — Other Ambulatory Visit (INDEPENDENT_AMBULATORY_CARE_PROVIDER_SITE_OTHER): Payer: Medicare Other

## 2014-06-22 DIAGNOSIS — I10 Essential (primary) hypertension: Secondary | ICD-10-CM

## 2014-06-22 LAB — BASIC METABOLIC PANEL
BUN: 26 mg/dL — ABNORMAL HIGH (ref 6–23)
CHLORIDE: 101 meq/L (ref 96–112)
CO2: 24 mEq/L (ref 19–32)
Calcium: 9 mg/dL (ref 8.4–10.5)
Creatinine, Ser: 1.5 mg/dL — ABNORMAL HIGH (ref 0.4–1.2)
GFR: 35.13 mL/min — ABNORMAL LOW (ref >60.00)
Glucose, Bld: 118 mg/dL — ABNORMAL HIGH (ref 70–99)
POTASSIUM: 3.1 meq/L — AB (ref 3.5–5.1)
SODIUM: 134 meq/L — AB (ref 135–145)

## 2014-06-22 NOTE — Patient Instructions (Signed)
, °

## 2014-06-23 ENCOUNTER — Ambulatory Visit: Payer: Medicare Other | Admitting: Physical Therapy

## 2014-06-23 DIAGNOSIS — IMO0001 Reserved for inherently not codable concepts without codable children: Secondary | ICD-10-CM | POA: Diagnosis not present

## 2014-06-27 ENCOUNTER — Ambulatory Visit: Payer: Medicare Other | Admitting: Physical Therapy

## 2014-06-28 ENCOUNTER — Ambulatory Visit: Payer: Medicare Other | Admitting: Physical Therapy

## 2014-06-28 DIAGNOSIS — IMO0001 Reserved for inherently not codable concepts without codable children: Secondary | ICD-10-CM | POA: Diagnosis not present

## 2014-06-30 ENCOUNTER — Ambulatory Visit: Payer: Medicare Other | Admitting: Physical Therapy

## 2014-07-03 ENCOUNTER — Encounter: Payer: Self-pay | Admitting: Internal Medicine

## 2014-07-03 ENCOUNTER — Ambulatory Visit (INDEPENDENT_AMBULATORY_CARE_PROVIDER_SITE_OTHER): Payer: Medicare Other | Admitting: Internal Medicine

## 2014-07-03 VITALS — BP 120/66 | HR 70 | Temp 98.0°F | Resp 20 | Ht 64.0 in | Wt 217.0 lb

## 2014-07-03 DIAGNOSIS — IMO0002 Reserved for concepts with insufficient information to code with codable children: Secondary | ICD-10-CM

## 2014-07-03 DIAGNOSIS — F0280 Dementia in other diseases classified elsewhere without behavioral disturbance: Secondary | ICD-10-CM

## 2014-07-03 DIAGNOSIS — I2510 Atherosclerotic heart disease of native coronary artery without angina pectoris: Secondary | ICD-10-CM

## 2014-07-03 DIAGNOSIS — E876 Hypokalemia: Secondary | ICD-10-CM

## 2014-07-03 DIAGNOSIS — G309 Alzheimer's disease, unspecified: Secondary | ICD-10-CM

## 2014-07-03 MED ORDER — TRIAMTERENE-HCTZ 37.5-25 MG PO CAPS: ORAL_CAPSULE | ORAL | Status: DC

## 2014-07-03 NOTE — Progress Notes (Signed)
Subjective:    Patient ID: [name], female    DOB: 20-May-1928, 80 y.o.   MRN: [id number]  HPI  BP Readings from Last 3 Encounters:  07/03/14 120/66  06/05/14 110/80  06/01/14 [id number]   80 year old patient whose medical problems include treated hypertension, coronary artery disease, dyslipidemia, and dementia. Blood pressure has been controlled with diuretic therapy.  Recent laboratory studies revealed an elevated creatinine as well as hypokalemia.  The patient generally feels well.  She does admit to difficulties with pedal edema  Medical record reviewed   Past Medical History  Diagnosis Date  . Coronary atherosclerosis of unspecified type of vessel, native or graft     nonobstructive; unspecified site  . Complications affecting other specified body systems, hypertension   . Other and unspecified hyperlipidemia   . Other B-complex deficiencies   . Localized osteoarthrosis not specified whether primary or secondary, unspecified site   . Memory loss   . Unspecified essential hypertension   . Arthritis     History   Social History  . Marital Status: Married    Spouse Name: Leonette MostCharles     Number of Children: 4  . Years of Education: 12   Occupational History  . Retired    Social History Main Topics  . Smoking status: Never Smoker   . Smokeless tobacco: Never Used  . Alcohol Use: No  . Drug Use: No  . Sexual Activity: Not on file   Other Topics Concern  . Not on file   Social History Narrative   Patient lives at home with husband Leonette MostCharles.   Patient is retired.    Patient is right handed.    Patient is a high school grad.           Past Surgical History  Procedure Laterality Date  . Cardiac catheterization  1992    real a 20% lesion in the LAD    History reviewed. No pertinent family history.  No Known Allergies  Current Outpatient Prescriptions on File Prior to Visit  Medication Sig Dispense Refill  . aspirin (ADULT ASPIRIN EC LOW STRENGTH) 81 MG  EC tablet Take 81 mg by mouth daily.        . Calcium-Vitamin D 600-125 MG-UNIT TABS Take 1 tablet by mouth daily.        . cyanocobalamin (,VITAMIN B-12,) 1000 MCG/ML injection Inject 1 mL (1,000 mcg total) into the muscle once.  30 mL  3  . Memantine HCl ER (NAMENDA XR) 28 MG CP24 Take 28 mg by mouth daily.  90 capsule  3  . Multiple Vitamins-Minerals (CENTRUM SILVER ULTRA WOMENS PO) Take 1 tablet by mouth every morning.       Marland Kitchen. QUEtiapine (SEROQUEL) 50 MG tablet TAKE 1 TABLET IN THE MORNING, 1 TABLET AFTER 2 P.M. AND 1 TABLET AT NIGHT  300 tablet  3  . Rivastigmine (EXELON) 13.3 MG/24HR PT24 APPLY 1 PATCH ONTO THE SKIN DAILY  90 patch  3   No current facility-administered medications on file prior to visit.    BP 120/66  Pulse 70  Temp(Src) 98 F (36.7 C) (Oral)  Resp 20  Ht 5\' 4"  (1.626 m)  Wt 217 lb (98.431 kg)  BMI 37.23 kg/m2  SpO2 96%    Review of Systems  Constitutional: Negative.   HENT: Negative for congestion, dental problem, hearing loss, rhinorrhea, sinus pressure, sore throat and tinnitus.   Eyes: Negative for pain, discharge and visual disturbance.  Respiratory: Negative for cough  and shortness of breath.   Cardiovascular: Negative for chest pain, palpitations and leg swelling.  Gastrointestinal: Negative for nausea, vomiting, abdominal pain, diarrhea, constipation, blood in stool and abdominal distention.  Genitourinary: Negative for dysuria, urgency, frequency, hematuria, flank pain, vaginal bleeding, vaginal discharge, difficulty urinating, vaginal pain and pelvic pain.  Musculoskeletal: Negative for arthralgias, gait problem and joint swelling.  Skin: Negative for rash.  Neurological: Negative for dizziness, syncope, speech difficulty, weakness, numbness and headaches.  Hematological: Negative for adenopathy.  Psychiatric/Behavioral: Positive for behavioral problems and confusion. Negative for dysphoric mood and agitation. The patient is not nervous/anxious.         Objective:   Physical Exam  Constitutional: She is oriented to person, place, and time. She appears well-developed and well-nourished.  Blood pressure 110/64  HENT:  Head: Normocephalic.  Right Ear: External ear normal.  Left Ear: External ear normal.  Mouth/Throat: Oropharynx is clear and moist.  Eyes: Conjunctivae and EOM are normal. Pupils are equal, round, and reactive to light.  Neck: Normal range of motion. Neck supple. No thyromegaly present.  Cardiovascular: Normal rate, regular rhythm, normal heart sounds and intact distal pulses.   Pulmonary/Chest: Effort normal and breath sounds normal.  Abdominal: Soft. Bowel sounds are normal. She exhibits no mass. There is no tenderness.  Musculoskeletal: Normal range of motion. She exhibits no edema.  Lymphadenopathy:    She has no cervical adenopathy.  Neurological: She is alert and oriented to person, place, and time.  Skin: Skin is warm and dry. No rash noted.  Psychiatric: She has a normal mood and affect. Her behavior is normal.          Assessment & Plan:   Hypertension.  Well controlled.  Blood pressure and a low normal range Hypokalemia with mild volume depletion.  We'll discontinue hydrochlorothiazide and decrease Dyazide to a Monday, Wednesday, Friday, regimen only.  We'll discontinue potassium supplementation after one week.  Recheck 3-4 weeks with lab at that time Dementia Dyslipidemia History of pedal edema

## 2014-07-03 NOTE — Progress Notes (Signed)
Pre visit review using our clinic review tool, if applicable. No additional management support is needed unless otherwise documented below in the visit note. 

## 2014-07-03 NOTE — Patient Instructions (Addendum)
Drink as much fluid as you  can tolerate over the next few days  Discontinue hydrochlorothiazide  Decrease triamterene-hydrochlorothiazide to Monday, Wednesday, Friday only  Recheck 4 weeks  Discontinue potassium after one week

## 2014-07-04 ENCOUNTER — Ambulatory Visit: Payer: Medicare Other | Admitting: Physical Therapy

## 2014-07-04 DIAGNOSIS — IMO0001 Reserved for inherently not codable concepts without codable children: Secondary | ICD-10-CM | POA: Diagnosis not present

## 2014-07-07 ENCOUNTER — Ambulatory Visit: Payer: Medicare Other | Admitting: Physical Therapy

## 2014-07-11 ENCOUNTER — Ambulatory Visit: Payer: Medicare Other | Admitting: Physical Therapy

## 2014-07-14 ENCOUNTER — Ambulatory Visit: Payer: Medicare Other | Admitting: Physical Therapy

## 2014-07-18 ENCOUNTER — Ambulatory Visit: Payer: Medicare Other | Admitting: Physical Therapy

## 2014-07-20 ENCOUNTER — Ambulatory Visit: Payer: Medicare Other

## 2014-07-31 ENCOUNTER — Ambulatory Visit: Payer: Medicare Other | Admitting: Family Medicine

## 2014-07-31 DIAGNOSIS — Z0289 Encounter for other administrative examinations: Secondary | ICD-10-CM

## 2014-08-11 NOTE — Telephone Encounter (Signed)
Noted  

## 2014-08-16 ENCOUNTER — Ambulatory Visit (INDEPENDENT_AMBULATORY_CARE_PROVIDER_SITE_OTHER): Payer: Medicare Other | Admitting: Neurology

## 2014-08-16 ENCOUNTER — Encounter: Payer: Self-pay | Admitting: Neurology

## 2014-08-16 VITALS — BP 138/72 | HR 69 | Resp 16

## 2014-08-16 DIAGNOSIS — F03918 Unspecified dementia, unspecified severity, with other behavioral disturbance: Secondary | ICD-10-CM

## 2014-08-16 DIAGNOSIS — R4182 Altered mental status, unspecified: Secondary | ICD-10-CM

## 2014-08-16 DIAGNOSIS — F0391 Unspecified dementia with behavioral disturbance: Secondary | ICD-10-CM

## 2014-08-16 DIAGNOSIS — I2510 Atherosclerotic heart disease of native coronary artery without angina pectoris: Secondary | ICD-10-CM
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
## 2014-08-22 ENCOUNTER — Other Ambulatory Visit: Payer: Medicare Other

## 2014-08-28 ENCOUNTER — Other Ambulatory Visit: Payer: Medicare Other

## 2014-09-01 ENCOUNTER — Other Ambulatory Visit: Payer: Medicare Other

## 2014-09-01 ENCOUNTER — Ambulatory Visit (INDEPENDENT_AMBULATORY_CARE_PROVIDER_SITE_OTHER): Payer: Medicare Other

## 2014-09-01 DIAGNOSIS — R4182 Altered mental status, unspecified: Secondary | ICD-10-CM

## 2014-09-01 DIAGNOSIS — F03918 Unspecified dementia, unspecified severity, with other behavioral disturbance: Secondary | ICD-10-CM

## 2014-09-01 DIAGNOSIS — F0391 Unspecified dementia with behavioral disturbance: Secondary | ICD-10-CM

## 2014-09-04 ENCOUNTER — Other Ambulatory Visit: Payer: Self-pay | Admitting: Family Medicine

## 2014-09-07 NOTE — Procedures (Signed)
[facility]  EEG (ELECTROENCEPHALOGRAM) REPORT  09-01-14 STUDY DATE:     PATIENT NAME: [name]   MRN: [id number]   ORDERING CLINICIAN: Melvyn Novas, MD   TECHNOLOGIST: Yetta Barre   TECHNIQUE: Electroencephalogram was recorded utilizing standard 10-20 system of lead placement and reformatted into average and bipolar montages.    RECORDING TIME:  24 minutes  ACTIVATION:  strobe lights and hyperventilation were deferred  CLINICAL INFORMATION:  80 year old female patient of [name], with progressing memory loss, confusion and agitation.  Fall let to evaluation at [facility] ED - no fracture, possible concussion.    FINDINGS: EEG  Background rhythm of  9 hertz . The  EKG heart rates varied from 62-66 bpm NSR.   No epileptiform discharges or periodic changes were noted . The patient was not exposed to photic stimulation but had ongoing eye blink artefact.  Patient recorded in the awake/ drowsy but not asleep state.   IMPRESSION:  EEG is surprisingly not slow for a patient with clinical evidence of organic brain dysfunction/ encephalopathy. This EEG documented no abnormalities .   Melvyn Novas , MD

## 2014-12-11 ENCOUNTER — Ambulatory Visit (INDEPENDENT_AMBULATORY_CARE_PROVIDER_SITE_OTHER): Payer: Medicare Other | Admitting: *Deleted

## 2014-12-11 DIAGNOSIS — Z23 Encounter for immunization: Secondary | ICD-10-CM

## 2015-01-10 ENCOUNTER — Other Ambulatory Visit: Payer: Self-pay | Admitting: Family Medicine

## 2015-01-25 ENCOUNTER — Other Ambulatory Visit: Payer: Self-pay | Admitting: Internal Medicine

## 2015-02-14 ENCOUNTER — Ambulatory Visit (INDEPENDENT_AMBULATORY_CARE_PROVIDER_SITE_OTHER): Payer: Medicare Other | Admitting: Adult Health

## 2015-02-14 ENCOUNTER — Encounter: Payer: Self-pay | Admitting: Adult Health

## 2015-02-14 VITALS — BP 139/69 | HR 67

## 2015-02-14 DIAGNOSIS — G309 Alzheimer's disease, unspecified: Secondary | ICD-10-CM | POA: Diagnosis not present

## 2015-02-14 DIAGNOSIS — F0280 Dementia in other diseases classified elsewhere without behavioral disturbance: Secondary | ICD-10-CM

## 2015-02-14 NOTE — Patient Instructions (Signed)
Continue Namenda and Exellon patch Memory score is stable- today was 24/30 If your symptoms worsen or you develop new symptoms please let us know.
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
## 2015-02-14 NOTE — Progress Notes (Signed)
I agree with the assessment and plan as directed by NP .The patient is known to me .   Zekiah Caruth, MD  

## 2015-03-19 ENCOUNTER — Telehealth: Payer: Self-pay | Admitting: Adult Health

## 2015-03-19 ENCOUNTER — Other Ambulatory Visit: Payer: Self-pay

## 2015-03-19 MED ORDER — LORAZEPAM 0.5 MG PO TABS: 0.5000 mg | ORAL_TABLET | Freq: Every day | ORAL | Status: DC | PRN

## 2015-03-19 NOTE — Telephone Encounter (Signed)
Patient's daughter, Marty SwazilandJordan @ [phone].  Stated pt is staying with other daughter in [place] temporarily, due to father in rehab due to amputation.  They have give pt 3 tablets of Rx QUEtiapine (SEROQUEL) 50 MG tablet and mom is very combative and irritable.  Questioning if patient could get a mild sedative.

## 2015-03-19 NOTE — Telephone Encounter (Signed)
Family called back with pharmacy info, chart updated.  Ativan added to med list with instruction per previous note.  Thank you.

## 2015-03-19 NOTE — Telephone Encounter (Signed)
Pharmacy added to chart.  Med added and request sent to provider for approval.
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
## 2015-03-19 NOTE — Telephone Encounter (Signed)
Pt's daughter is calling to give back Pharmacy information for [place].  [facility] [address], [place] [phone].  Please call if you have any questions.

## 2015-03-19 NOTE — Telephone Encounter (Signed)
Rx signed and faxed.

## 2015-03-21 ENCOUNTER — Other Ambulatory Visit: Payer: Self-pay | Admitting: Family Medicine

## 2015-03-22 NOTE — Telephone Encounter (Signed)
I called the pharmacy.  Pharmacist does have Rx, they are filling it today, and will notify patient when it is ready for pick up.  There was not a number provided for [name], so I called the only number we have on file for the patient.  Phone rang nearly 20 times with no answer, then the line started rapidly beeping.  No option to leave message.

## 2015-03-22 NOTE — Telephone Encounter (Signed)
Pt's daughter is calling back regarding the Rx for LORazepam (ATIVAN) 0.5 MG tablet.  [facility] [place] [facility] not [facility].  Please call and advise.

## 2015-03-23 ENCOUNTER — Telehealth: Payer: Self-pay | Admitting: Neurology

## 2015-03-23 DIAGNOSIS — R451 Restlessness and agitation: Secondary | ICD-10-CM

## 2015-03-23 DIAGNOSIS — F0391 Unspecified dementia with behavioral disturbance: Secondary | ICD-10-CM

## 2015-03-23 DIAGNOSIS — F03918 Unspecified dementia, unspecified severity, with other behavioral disturbance: Secondary | ICD-10-CM

## 2015-03-23 MED ORDER — LORAZEPAM 0.5 MG PO TABS: 0.5000 mg | ORAL_TABLET | Freq: Every day | ORAL | Status: DC | PRN

## 2015-03-23 NOTE — Telephone Encounter (Signed)
[facility] [place].  They verified Ativan has been ready for pick up, but no one has been in to get the Rx.  I called the patient back at the only number listed on file.  Got no answer, no option to leave message.

## 2015-03-23 NOTE — Telephone Encounter (Signed)
I talked to patient's daughter Johnny BridgeMartha at her work number. The patient has had severe agitation and confusion and behavioral escalations with crying spells and anger outbursts and physical agitation over the past couple weeks. she is at her other daughter's home in [place] which is outside of her typical living environment because of her husband's illness and he had to be in rehabilitation. He may be in rehabilitation for another 3 weeks according to [place]. The situation is difficult to handle for the family. It used to be where she could be calm down verbally with reassurance especially through her son-in-law in [place] but she is not listening to him either. On 03/19/2015 a prescription was placed for Ativan 0.5 mg as needed for agitation. Johnny BridgeMartha says that the [facility] [place] never received that prescription. I will reassure that prescription today and fax it. Patient's daughter is reminded that sometimes benzodiazepines can have a paradoxical effect on the elderly, causing more agitation and behavioral changes so she has to use this with caution and with full supervision of her mom. Her other sister who is with the patient currently works full-time but from home and patient is never left alone. I suggested she go ahead and try Ativan as needed only once daily at the most. I do not feel comfortable increasing the Seroquel which is currently 1 pill 3 times a day for a total of 150 mg.

## 2015-03-24 ENCOUNTER — Emergency Department (HOSPITAL_COMMUNITY)
Admission: EM | Admit: 2015-03-24 | Discharge: 2015-03-25 | Disposition: A | Discharge status: Home / Self Care | Payer: Medicare Other | Attending: Emergency Medicine | Admitting: Emergency Medicine

## 2015-03-24 ENCOUNTER — Encounter (HOSPITAL_COMMUNITY): Payer: Self-pay | Admitting: Nurse Practitioner

## 2015-03-24 DIAGNOSIS — M1990 Unspecified osteoarthritis, unspecified site: Secondary | ICD-10-CM | POA: Diagnosis not present

## 2015-03-24 DIAGNOSIS — Z9889 Other specified postprocedural states: Secondary | ICD-10-CM | POA: Insufficient documentation

## 2015-03-24 DIAGNOSIS — Z79899 Other long term (current) drug therapy: Secondary | ICD-10-CM | POA: Insufficient documentation

## 2015-03-24 DIAGNOSIS — I10 Essential (primary) hypertension: Secondary | ICD-10-CM | POA: Insufficient documentation

## 2015-03-24 DIAGNOSIS — R413 Other amnesia: Secondary | ICD-10-CM | POA: Diagnosis not present

## 2015-03-24 DIAGNOSIS — E785 Hyperlipidemia, unspecified: Secondary | ICD-10-CM | POA: Insufficient documentation

## 2015-03-24 DIAGNOSIS — I2510 Atherosclerotic heart disease of native coronary artery without angina pectoris: Secondary | ICD-10-CM | POA: Insufficient documentation

## 2015-03-24 DIAGNOSIS — Z7982 Long term (current) use of aspirin: Secondary | ICD-10-CM | POA: Diagnosis not present

## 2015-03-24 DIAGNOSIS — F911 Conduct disorder, childhood-onset type: Secondary | ICD-10-CM | POA: Diagnosis present

## 2015-03-24 LAB — RAPID URINE DRUG SCREEN, HOSP PERFORMED
Amphetamines: NOT DETECTED
Barbiturates: NOT DETECTED
Benzodiazepines: NOT DETECTED
Cocaine: NOT DETECTED
Opiates: NOT DETECTED
Tetrahydrocannabinol: NOT DETECTED

## 2015-03-24 LAB — ETHANOL: Alcohol, Ethyl (B): <5 mg/dL (ref 0–9)

## 2015-03-24 LAB — CBC WITH DIFFERENTIAL/PLATELET
Basophils Absolute: 0 10*3/uL (ref 0.0–0.1)
Basophils Relative: 0 % (ref 0–1)
Eosinophils Absolute: 0.2 10*3/uL (ref 0.0–0.7)
Eosinophils Relative: 3 % (ref 0–5)
HCT: 37.4 % (ref 36.0–46.0)
Hemoglobin: 12.4 g/dL (ref 12.0–15.0)
Lymphocytes Relative: 22 % (ref 12–46)
Lymphs Abs: 1.4 10*3/uL (ref 0.7–4.0)
MCH: 30 pg (ref 26.0–34.0)
MCHC: 33.2 g/dL (ref 30.0–36.0)
MCV: 90.6 fL (ref 78.0–100.0)
Monocytes Absolute: 0.5 10*3/uL (ref 0.1–1.0)
Monocytes Relative: 8 % (ref 3–12)
Neutro Abs: 4.4 10*3/uL (ref 1.7–7.7)
Neutrophils Relative %: 67 % (ref 43–77)
Platelets: 251 10*3/uL (ref 150–400)
RBC: 4.13 MIL/uL (ref 3.87–5.11)
RDW: 14.2 % (ref 11.5–15.5)
WBC: 6.4 10*3/uL (ref 4.0–10.5)

## 2015-03-24 LAB — COMPREHENSIVE METABOLIC PANEL
ALT: 18 U/L (ref 0–35)
AST: 22 U/L (ref 0–37)
Albumin: 3.3 g/dL — ABNORMAL LOW (ref 3.5–5.2)
Alkaline Phosphatase: 158 U/L — ABNORMAL HIGH (ref 39–117)
Anion gap: 10 (ref 5–15)
BUN: 16 mg/dL (ref 6–23)
CO2: 26 mmol/L (ref 19–32)
Calcium: 9.1 mg/dL (ref 8.4–10.5)
Chloride: 101 mmol/L (ref 96–112)
Creatinine, Ser: 1.18 mg/dL — ABNORMAL HIGH (ref 0.50–1.10)
GFR calc Af Amer: 47 mL/min — ABNORMAL LOW (ref >90)
GFR calc non Af Amer: 41 mL/min — ABNORMAL LOW (ref >90)
Glucose, Bld: 98 mg/dL (ref 70–99)
Potassium: 3 mmol/L — ABNORMAL LOW (ref 3.5–5.1)
Sodium: 137 mmol/L (ref 135–145)
Total Bilirubin: 1.2 mg/dL (ref 0.3–1.2)
Total Protein: 6.4 g/dL (ref 6.0–8.3)

## 2015-03-24 MED ORDER — ATORVASTATIN CALCIUM 80 MG PO TABS
80.0000 mg | ORAL_TABLET | Freq: Every day | ORAL | Status: DC
  Administered 2015-03-24: 80 mg via ORAL
  Filled 2015-03-24 (×2): qty 1

## 2015-03-24 MED ORDER — QUETIAPINE FUMARATE 25 MG PO TABS
50.0000 mg | ORAL_TABLET | Freq: Three times a day (TID) | ORAL | Status: DC
  Administered 2015-03-24 – 2015-03-25 (×2): 50 mg via ORAL
  Filled 2015-03-24 (×2): qty 2

## 2015-03-24 MED ORDER — LORAZEPAM 1 MG PO TABS: 1.0000 mg | ORAL_TABLET | Freq: Three times a day (TID) | ORAL | Status: DC | PRN

## 2015-03-24 MED ORDER — ACETAMINOPHEN 325 MG PO TABS: 650.0000 mg | ORAL_TABLET | ORAL | Status: DC | PRN

## 2015-03-24 MED ORDER — MEMANTINE HCL ER 28 MG PO CP24
28.0000 mg | ORAL_CAPSULE | Freq: Every day | ORAL | Status: DC
  Administered 2015-03-24: 28 mg via ORAL
  Filled 2015-03-24 (×2): qty 1

## 2015-03-24 MED ORDER — POTASSIUM CHLORIDE CRYS ER 20 MEQ PO TBCR
10.0000 meq | EXTENDED_RELEASE_TABLET | Freq: Two times a day (BID) | ORAL | Status: DC
  Administered 2015-03-24 – 2015-03-25 (×2): 10 meq via ORAL
  Filled 2015-03-24 (×2): qty 1

## 2015-03-24 MED ORDER — NICOTINE 21 MG/24HR TD PT24: 21.0000 mg | MEDICATED_PATCH | Freq: Every day | TRANSDERMAL | Status: DC
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
## 2015-03-24 NOTE — Progress Notes (Signed)
Disposition: Clinician consulted with Renata Capriceonrad, NP who states patient does not meet inpatient psych criteria at this time and is psychiatrically cleared.   Recommendation for follow up with social work to identify resources to assist family with care or potential ALF placement if needed.   Tinnie GensJeffrey, [place] notified by clinician.   Janann ColonelGregory Pickett Jr. MSW, LCSW Therapeutic Triage Services-Triage Specialist   Phone: [phone] Fax: [phone]

## 2015-03-24 NOTE — Discharge Instructions (Signed)
It is my advice that the primary care patient because didn't informed of today's visit and all relevant information. If he continues to be difficulties with current living situation perhaps outpatient placement would be in order. It is my understanding that his processes aren't even started and should be followed up.
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
## 2015-03-24 NOTE — ED Notes (Addendum)
ATTEMPTED TO CALL PT'S DAUGHTER - PAM BROWN - [phone].

## 2015-03-24 NOTE — ED Notes (Signed)
PT'S DAUGHTER, MARTY Swazil[place]JORDAN, CALLED AND ADVISED SHE IS PLANNING ON PICKING UP PT TOMORROW AND IS REQUESTING IF WE KNOW OF A COMPANY WHO PROVIDES A SITTING SERVICE FOR SOMEONE TO STAY W/PT TOMORROW NIGHT. ADVISED HER RN WILL CONSULT W/CASE MANAGER AND SW IN AM AND WILL CALL HER TO ADVISE. VOICED UNDERSTANDING AND APPRECIATION.

## 2015-03-24 NOTE — ED Notes (Signed)
Spoke to [place] case management.

## 2015-03-24 NOTE — ED Notes (Signed)
phlebotomy at bedside.  
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
## 2015-03-24 NOTE — Progress Notes (Signed)
Received report from HodgeAshura, Charity fundraiserN. TTS to initiate assessment.   Janann ColonelGregory Pickett Jr. MSW, LCSW Therapeutic Triage Services-Triage Specialist   Phone: [phone] Fax: [phone]

## 2015-03-24 NOTE — ED Notes (Signed)
Patient asked for and received a diet coke and sugar free cookies.

## 2015-03-24 NOTE — ED Notes (Signed)
TTS being performed by Tammy SoursGreg, SW, [facility].
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
## 2015-03-24 NOTE — Progress Notes (Signed)
CSW responded to Social Work consult with this 81 y/o married, Caucasian, female patient.  Patient was brought in by family due to aggressive behaviors.  CSW consulted with the Nurse CM, Nursing Staff, and Attending PA-C.  Patient has been cleared by psych and does not warrant any medical hospitalization. Patient's family recently discovered that this aggressive behavior has been going on for some time and her husband has been hiding it from the family. Since the patient has been medically and psychiatrically cleared and their is other family in the house she will most likely be discharged home.  Plan: Most likely this behavior is a symptom of dementia and since the family does not feel they can care for her anymore (her husband is currently in a SNF) they will need to:  1) Follow up with their PCP on Monday to complete a FL-2. 2) Apply for [place] Medicaid at DSS (Depending on their financial circumstances). 3) Consult with an Engineer, mininglder Attorney for legal advice on estate planning and planning for placement. 4) Visit and decide which facility they would like to apply for, their PCP can give a list of facilities or they can call Social Work at [facility].  The EDP stated he would speak to the daughter that was the patient's POA and contact this CSW as needed.  [facility] [name] Macy MisLCSW,LCAS [place] ED CSW [phone]

## 2015-03-25 DIAGNOSIS — R413 Other amnesia: Secondary | ICD-10-CM | POA: Diagnosis not present

## 2015-03-25 NOTE — ED Notes (Signed)
Pt sleeping at present

## 2015-03-25 NOTE — ED Notes (Signed)
Pt sleeping. 

## 2015-03-25 NOTE — ED Notes (Signed)
Sharl MaMarty, pt's daughter, advised she will be coming to pick up pt within the hour. Advised her will have info re: private sitters for her ready.

## 2015-03-25 NOTE — ED Notes (Signed)
Breakfast brought to patient 

## 2015-03-25 NOTE — ED Notes (Signed)
PT'S KNEE SLEEVE WET FROM SHOWER - PLACED IN BELONGINGS BAG W/WHITE SOCKS.

## 2015-03-26 ENCOUNTER — Telehealth: Payer: Self-pay | Admitting: Adult Health

## 2015-03-26 NOTE — Telephone Encounter (Signed)
Called daughter back and relayed Aundra MilletMegan is still seeing patient's and I would fax as soon as FL2 was complete. Daughter want's me to call her in the morning and let her know what the status is.

## 2015-03-26 NOTE — Telephone Encounter (Signed)
Pt's daughter is calling regarding FL2 form.  She would like a phone call back today before you leave.  Please call

## 2015-03-27 NOTE — Telephone Encounter (Signed)
Forms signed. Ready for pick up.

## 2015-03-27 NOTE — Telephone Encounter (Signed)
Faxed FL2 form to Attention Tresa EndoKelly [phone]. Called daughter to pick up original.

## 2015-03-28 ENCOUNTER — Telehealth: Payer: Self-pay | Admitting: Neurology

## 2015-03-28 NOTE — Telephone Encounter (Signed)
I talked to the patient's daughter, Dorothyann GibbsMarti, again on 03/24/2015. They had taken the patient to the hospital emergency room because of more agitation and restlessness. She was checked out and there was no treatable cause. They had tried Ativan one time and it sedated her. She requested that we help fill out a form for her mother. I suggested she drop the form of on Monday, 03/26/2015 and we would have a look. Forms were dropped off and [facility] 03/27/2015.

## 2015-03-29 ENCOUNTER — Telehealth: Payer: Self-pay | Admitting: *Deleted

## 2015-03-29 NOTE — Telephone Encounter (Signed)
Order has been faxed

## 2015-03-29 NOTE — Telephone Encounter (Signed)
Faxed request for pt who is staying at respite care at [facility].  Asking for order for ativan 0.5mg    (1 tablet po q day prn agitation).    [phone], [phone]

## 2015-04-24 ENCOUNTER — Other Ambulatory Visit: Payer: Self-pay | Admitting: Family Medicine

## 2015-04-28 ENCOUNTER — Other Ambulatory Visit: Payer: Self-pay | Admitting: Family Medicine

## 2015-05-12 ENCOUNTER — Other Ambulatory Visit: Payer: Self-pay | Admitting: Family Medicine

## 2015-05-15 ENCOUNTER — Other Ambulatory Visit: Payer: Self-pay | Admitting: Family Medicine

## 2015-06-06 ENCOUNTER — Telehealth: Payer: Self-pay | Admitting: Family Medicine

## 2015-06-06 NOTE — Telephone Encounter (Signed)
persina from [facility] called to see if dr todd wuold be signing pt's fl2 and a few other orders. She states they faxed in and have not heard anything. She is leaving for today if you can call back tomorrow

## 2015-06-07 NOTE — Telephone Encounter (Signed)
Dr Todd is not in the office. 

## 2015-06-08 ENCOUNTER — Other Ambulatory Visit: Payer: Self-pay | Admitting: Family Medicine

## 2015-06-18 ENCOUNTER — Other Ambulatory Visit: Payer: Self-pay | Admitting: Family Medicine

## 2015-07-02 ENCOUNTER — Telehealth: Payer: Self-pay | Admitting: Neurology

## 2015-07-02 MED ORDER — ESCITALOPRAM OXALATE 5 MG PO TABS: 5.0000 mg | ORAL_TABLET | Freq: Every day | ORAL | Status: DC

## 2015-07-02 NOTE — Telephone Encounter (Signed)
Dr. Vickey Hugerohmeier recommends lexapro for this pt. Lexapro 5 mg daily in morning with food 30 day supply. I informed pts daughter. She requests that the [facility] [address]

## 2015-07-02 NOTE — Telephone Encounter (Signed)
Patient's daughter(Martha, she goes by Sharl MaMarty) called stating patient is at [facility] currently but she will be moved to another facility soon. She is very depressed, angry, has expressed to her husband that she is going to "end it all". The husband was at [facility] at one time but is no longer a resident there. The patient does not realize he is not living there anymore so each time he leaves he has to get a RN to help. Her alzheimer has gotten worse. The daughter said she and her sister were there on Saturday and the facility had her outside and were playing games but when they brought her back in she began to cry and said she was going to commit suicide. The daughter is inquiring if she could be started on an antidepressant. Please call and advise. She can be reached at [phone].

## 2015-07-09 ENCOUNTER — Telehealth: Payer: Self-pay | Admitting: Neurology

## 2015-07-09 MED ORDER — MEMANTINE HCL ER 28 MG PO CP24: ORAL_CAPSULE | ORAL | Status: DC

## 2015-07-09 NOTE — Telephone Encounter (Signed)
Namenda has been sent to [facility] per patient request.   [facility], however, it has now been re-faxed to [facility].  I called back.  Spoke with Mr Kimbley.  He is aware.

## 2015-07-09 NOTE — Telephone Encounter (Signed)
Spoke to pt's daughter. She is requesting a letter from Dr. Vickey Huger stating that she prescribed lexapro for the patient and asking [facility] to administer the lexapro. She said her father (pt's husband) could pick up the letter tomorrow.

## 2015-07-09 NOTE — Telephone Encounter (Signed)
Patients daughter called and stated that the patient needs a written order to take the anti depressant that Dr. Vickey Huger gave her. Please call and advise.

## 2015-07-09 NOTE — Telephone Encounter (Signed)
Pt's husband called and states that [facility] needs a written Rx for escitalopram (LEXAPRO) 5 MG tablet.May fax order to [phone]. They will not give the medication that the husband received from the pharmacy. May call [phone]. NAMENDA XR 28 MG CP24 24 hr capsule needs refill and please order from [facility].

## 2015-07-22 ENCOUNTER — Other Ambulatory Visit: Payer: Self-pay | Admitting: Family Medicine

## 2015-07-23 ENCOUNTER — Telehealth: Payer: Self-pay | Admitting: Neurology

## 2015-07-23 NOTE — Telephone Encounter (Signed)
Daughter Marty Swaziland called to let you know that the anti-depressant Dr Vickey Huger put her mother on is working very well and would like prescription sent into [facility] (pt was only given 30 day supply to see how pt would do on this medication)

## 2015-07-24 ENCOUNTER — Other Ambulatory Visit: Payer: Self-pay

## 2015-07-24 MED ORDER — ESCITALOPRAM OXALATE 5 MG PO TABS: 5.0000 mg | ORAL_TABLET | Freq: Every day | ORAL | Status: DC

## 2015-07-24 NOTE — Telephone Encounter (Signed)
Anise Salvo, pt's daughter, that Dr. Vickey Huger approved a refill on the lexapro 5 mg daily for 90 tablets and 3 refills, [facility]. Pt's daughter reports that her mother is doing much better on the lexapro, she is not depressed.

## 2015-08-12 ENCOUNTER — Other Ambulatory Visit: Payer: Self-pay | Admitting: Family Medicine

## 2015-08-13 ENCOUNTER — Telehealth: Payer: Self-pay

## 2015-08-13 ENCOUNTER — Telehealth: Payer: Self-pay | Admitting: Neurology

## 2015-08-13 DIAGNOSIS — F0391 Unspecified dementia with behavioral disturbance: Secondary | ICD-10-CM

## 2015-08-13 DIAGNOSIS — F03918 Unspecified dementia, unspecified severity, with other behavioral disturbance: Secondary | ICD-10-CM

## 2015-08-13 MED ORDER — RIVASTIGMINE 13.3 MG/24HR TD PT24: MEDICATED_PATCH | TRANSDERMAL | Status: DC

## 2015-08-13 MED ORDER — ESCITALOPRAM OXALATE 10 MG PO TABS: 10.0000 mg | ORAL_TABLET | Freq: Every day | ORAL | Status: DC

## 2015-08-13 NOTE — Telephone Encounter (Signed)
Advised pt's daughter, that we did not have the exelon patch listed on pt's medications

## 2015-08-13 NOTE — Telephone Encounter (Signed)
I faxed RX for lexapro and exelon from Dr. Vickey Huger to [facility]. Fax confirmation received.

## 2015-08-13 NOTE — Telephone Encounter (Signed)
Pt's daughter Sharl Ma) called and said pt was moved to [facility]. She is asking if the lexapro  can be increased because the pt still crying at night and threatening to kill herself. The lexapro worked for a while when the pt was at [facility] but with the new transition, pt is very depressed. Pt's daughter knows adjusting to the new surroundings might take time, but she still is asking for an increase in the lexapro, or perhaps another medication if Dr. Vickey Huger warrants it. I advised pt that either I or Dr. Vickey Huger would call her back.  Pt's daughter can be reached at [phone].

## 2015-08-13 NOTE — Telephone Encounter (Signed)
I will increase the Lexapro to 10 mg daily , and it could be further increased at a later point to 20 mg.  There may be a lot of yawning as a side effect right after the medication increase, that will go away.  patient moved one week ago, and she cries all night. I spoke to [place], her daughter .  She needs to return to excelon patch . [name] is the prescriber of the Exelon patch and is currently on leave. I  prescribed the Exelon patch for him,  printed the script  and have it faxed to [facility]- her current facility. I also increased the dose of Lexapro in Epic

## 2015-08-13 NOTE — Telephone Encounter (Signed)
Daughter called to advise that mom was recently placed in [facility]. FL2 that was recently filled out by [place] forgot to include EXELON 13.3 MG/24HR PT24. Fax to [facility] [phone] Attn: Elnita Maxwell (Director).

## 2015-08-13 NOTE — Telephone Encounter (Signed)
I called [facility] and asked them to fax [place] the FL-2 they have and we will addend it to include the exelon. Dr. Vickey Huger has signed the FL-2 and I will fax it back.

## 2015-08-22 ENCOUNTER — Ambulatory Visit: Payer: Medicare Other | Admitting: Adult Health

## 2015-08-28 ENCOUNTER — Telehealth: Payer: Self-pay | Admitting: Neurology

## 2015-08-28 NOTE — Telephone Encounter (Signed)
Spoke to [employer], per DPR. Pt's daughter needs a letter stating that Dr. Vickey Huger is treating pt for Alzheimer's dementia. This letter for legal purposes for pt's attorney. I advised pt that Dr. Vickey Huger would write letter. Pt's daughter wants to pick up the letter at the front desk. I advised her that it would be ready by this afternoon.

## 2015-08-28 NOTE — Telephone Encounter (Signed)
Patient's daughter Elita Quick is calling and states that she needs a letter stating that patient has dementia so that the daughter can take care of some legal matters.  She needs letter by Sept 19th. Please call  Thanks!

## 2015-09-07 ENCOUNTER — Other Ambulatory Visit: Payer: Self-pay | Admitting: Family Medicine

## 2015-09-16 ENCOUNTER — Other Ambulatory Visit: Payer: Self-pay | Admitting: Family Medicine

## 2015-10-03 ENCOUNTER — Ambulatory Visit: Payer: Medicare Other | Admitting: Adult Health

## 2015-10-09 ENCOUNTER — Ambulatory Visit: Payer: Medicare Other | Admitting: Adult Health

## 2015-10-16 ENCOUNTER — Telehealth: Payer: Self-pay | Admitting: Adult Health

## 2015-10-16 NOTE — Telephone Encounter (Signed)
I believe I spoke to her daughter today. She wants to speak to [facility] once the visit is over with because she said she will not be coming to the visit since it confuses her. She said she just wants to know how the visit goes.The best number to contact her is [phone].

## 2015-10-16 NOTE — Telephone Encounter (Signed)
Noted. I will see the patient this week.

## 2015-10-18 ENCOUNTER — Ambulatory Visit (INDEPENDENT_AMBULATORY_CARE_PROVIDER_SITE_OTHER): Payer: Medicare Other | Admitting: Adult Health

## 2015-10-18 ENCOUNTER — Encounter: Payer: Self-pay | Admitting: Adult Health

## 2015-10-18 VITALS — BP 152/74 | HR 57 | Ht 64.0 in

## 2015-10-18 DIAGNOSIS — G309 Alzheimer's disease, unspecified: Secondary | ICD-10-CM

## 2015-10-18 DIAGNOSIS — F0280 Dementia in other diseases classified elsewhere without behavioral disturbance: Secondary | ICD-10-CM

## 2015-10-18 NOTE — Progress Notes (Signed)
I agree with the assessment and plan as directed by NP .The patient is known to me .   Jearldine Cassady, MD  

## 2015-10-18 NOTE — Patient Instructions (Signed)
Continue namenda and Exelon  If your symptoms worsen or you develop new symptoms please let us know.
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
## 2015-10-22 ENCOUNTER — Telehealth: Payer: Self-pay | Admitting: Adult Health

## 2015-10-22 NOTE — Telephone Encounter (Signed)
I called the daughter and left a message for her to call the office.

## 2015-10-22 NOTE — Telephone Encounter (Signed)
I called that daughter. During the office visit the patient was amenable to me speaking with her daughter to give her an update. I spoke to the daughter this morning and gave her an update on her mom's condition. The daughter states that she agrees that her mom's been doing fairly well. She states that occasionally they will have to give her Ativan if she becomes upset. I advised the daughter that she can call us at any time if she has any other questions or concerns.

## 2015-10-22 NOTE — Telephone Encounter (Signed)
Marella BilePam Brown, daughter, is returning your call of this morning about her mother. Phone [phone].  Thanks!

## 2015-11-09 ENCOUNTER — Other Ambulatory Visit: Payer: Self-pay | Admitting: Family Medicine

## 2016-01-04 ENCOUNTER — Other Ambulatory Visit: Payer: Self-pay | Admitting: Neurology

## 2016-04-16 ENCOUNTER — Encounter: Payer: Self-pay | Admitting: Adult Health

## 2016-04-16 ENCOUNTER — Ambulatory Visit (INDEPENDENT_AMBULATORY_CARE_PROVIDER_SITE_OTHER): Payer: Medicare Other | Admitting: Adult Health

## 2016-04-16 VITALS — BP 137/71 | HR 60 | Ht 64.0 in

## 2016-04-16 DIAGNOSIS — G308 Other Alzheimer's disease: Secondary | ICD-10-CM

## 2016-04-16 DIAGNOSIS — F0280 Dementia in other diseases classified elsewhere without behavioral disturbance: Secondary | ICD-10-CM

## 2016-04-16 NOTE — Patient Instructions (Signed)
Continue Namenda and Exelon Memory score slightly decreased If your symptoms worsen or you develop new symptoms please let us know.

## 2016-04-16 NOTE — Progress Notes (Signed)
I agree with the assessment and plan as directed by NP .The patient is known to me .   Aurorah Schlachter, MD  
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
## 2016-04-29 ENCOUNTER — Telehealth: Payer: Self-pay | Admitting: *Deleted

## 2016-04-29 NOTE — Telephone Encounter (Signed)
I called and spoke to pam, daughter of pt.  I relayed that below was addressed and pt on exelon capsules po bid, she also asked about visit.   I relayed some decline on MMSE from 17 to 13 and to keep on exelon and namenda.  Now on exelon caps.

## 2016-04-29 NOTE — Telephone Encounter (Signed)
Received letter from [facility] [employer] about coverage determination relating to Rivastigmine transdermal system patch.  They were dismissing the request for coverage determination.  I spoke to husband and LMVM for Pam, daughter of pt.  I spoke with Mindy at [facility] [place].  [phone].   They also received letter and change was made to exelon capsules po bid by her pcp, Dr. Donnel SaxonImran Haque in [place].  ([facility]).  Nothing needed on our part.

## 2016-04-29 NOTE — Telephone Encounter (Signed)
Noted. Thanks.

## 2016-04-29 NOTE — Addendum Note (Signed)
Addended byHermenia Fiscal: Makailyn Mccormick on: 04/29/2016 04:06 PM   Modules accepted: Orders, Medications

## 2016-04-29 NOTE — Telephone Encounter (Signed)
[facility]: [facility]                  Taken 16-MAY-17 at  2:03PM by TMW ------------------------------------------------------------  Caller  PAM Dubo[place]ownBROWN                   CID  [id number]   Patient  [name]           Pt's Dr  NOT Ronette DeterSURE      Area Code  336  Phone#  [phone] *  DOB  [birth year]       RE  RETURNING CALL                                                                                          Disp:Y/N  N  If Y = C/B If No Response In 20minutes  ============================================================

## 2016-10-20 ENCOUNTER — Ambulatory Visit: Payer: Medicare Other | Admitting: Adult Health

## 2016-10-21 ENCOUNTER — Encounter: Payer: Self-pay | Admitting: Adult Health

## 2016-10-27 ENCOUNTER — Ambulatory Visit (INDEPENDENT_AMBULATORY_CARE_PROVIDER_SITE_OTHER): Payer: Medicare Other | Admitting: Adult Health

## 2016-10-27 ENCOUNTER — Encounter: Payer: Self-pay | Admitting: Adult Health

## 2016-10-27 VITALS — BP 130/68 | HR 68

## 2016-10-27 DIAGNOSIS — F0280 Dementia in other diseases classified elsewhere without behavioral disturbance: Secondary | ICD-10-CM

## 2016-10-27 DIAGNOSIS — G309 Alzheimer's disease, unspecified: Secondary | ICD-10-CM

## 2016-10-27 NOTE — Progress Notes (Signed)
I agree with the assessment and plan as directed by NP .The patient is known to me .   Madysun Thall, MD  

## 2016-10-27 NOTE — Patient Instructions (Signed)
Continue Exelon patch and namenda Memory score is stable If your symptoms worsen or you develop new symptoms please let us know.
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
## 2016-10-30 ENCOUNTER — Telehealth: Payer: Self-pay | Admitting: Adult Health

## 2016-10-30 NOTE — Telephone Encounter (Addendum)
Daughter Marella Bileam Brown called to inquire how Mother's visit went on November 13th, states "someone usually calls me". Called Debra/Medical Records regarding DPR, Stanton KidneyDebra took the call.

## 2016-11-03 NOTE — Telephone Encounter (Signed)
Please call with update.

## 2016-11-03 NOTE — Telephone Encounter (Signed)
Per Dolores HooseM Millikan, NP, spoke with daughter, Elita Quickam and informed her that her mother's memory score has remained stable. She will continue on the Exelon patch and Namenda. Dolores HooseM Millikan advised that if her symptoms worsen or she develops any new symptoms she she should let us know. Advised she has a follow up with Dr Vickey Hugerohmeier 04/28/17. Pam verbalized understanding, appreciation for call.

## 2017-04-23 ENCOUNTER — Telehealth: Payer: Self-pay | Admitting: Neurology

## 2017-04-23 NOTE — Telephone Encounter (Signed)
Pt daughter stating the memory care unit is bringing pt on 5-15. Pt daughter is not coming but wants Dr Vickey Hugerohmeier to increase medication if need be as a result of pt having an increase in being emotional and depression(according to what the staff is telling the daughter) Daughter also stating staff informed her pt has become angry at moments and also become combative at times (which is not typical behavior of pt)

## 2017-04-23 NOTE — Telephone Encounter (Signed)
I took note of her daughter's information related to her mothers progressive dementia and emotional instability. CD

## 2017-04-28 ENCOUNTER — Encounter (INDEPENDENT_AMBULATORY_CARE_PROVIDER_SITE_OTHER): Payer: Self-pay

## 2017-04-28 ENCOUNTER — Ambulatory Visit (INDEPENDENT_AMBULATORY_CARE_PROVIDER_SITE_OTHER): Payer: Medicare Other | Admitting: Neurology

## 2017-04-28 ENCOUNTER — Encounter: Payer: Self-pay | Admitting: Neurology

## 2017-04-28 VITALS — BP 144/69 | HR 59

## 2017-04-28 DIAGNOSIS — F0281 Dementia in other diseases classified elsewhere with behavioral disturbance: Secondary | ICD-10-CM

## 2017-04-28 DIAGNOSIS — F01518 Vascular dementia, unspecified severity, with other behavioral disturbance: Secondary | ICD-10-CM

## 2017-04-28 DIAGNOSIS — G301 Alzheimer's disease with late onset: Secondary | ICD-10-CM | POA: Diagnosis not present

## 2017-04-28 DIAGNOSIS — F0151 Vascular dementia with behavioral disturbance: Secondary | ICD-10-CM | POA: Diagnosis not present

## 2017-04-28 MED ORDER — MEMANTINE HCL 10 MG PO TABS: 10.0000 mg | ORAL_TABLET | Freq: Two times a day (BID) | ORAL | 6 refills | Status: AC

## 2017-04-28 MED ORDER — ESCITALOPRAM OXALATE 10 MG PO TABS: 10.0000 mg | ORAL_TABLET | Freq: Every day | ORAL | 1 refills | Status: DC
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
## 2018-04-28 ENCOUNTER — Encounter: Payer: Self-pay | Admitting: Adult Health

## 2018-04-28 ENCOUNTER — Ambulatory Visit (INDEPENDENT_AMBULATORY_CARE_PROVIDER_SITE_OTHER): Payer: Medicare Other | Admitting: Adult Health

## 2018-04-28 VITALS — BP 118/68 | HR 65

## 2018-04-28 DIAGNOSIS — G309 Alzheimer's disease, unspecified: Secondary | ICD-10-CM

## 2018-04-28 DIAGNOSIS — F0281 Dementia in other diseases classified elsewhere with behavioral disturbance: Secondary | ICD-10-CM | POA: Diagnosis not present

## 2018-04-28 NOTE — Patient Instructions (Signed)
Your Plan:  Continue Exelon and Namenda Memory score is stable  If your symptoms worsen or you develop new symptoms please let us know.    Thank you for coming to see [place] at [facility]. I hope we have been able to provide you high quality care today.  You may receive a patient satisfaction survey over the next few weeks. We would appreciate your feedback and comments so that we may continue to improve ourselves and the health of our patients.
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
## 2018-05-07 NOTE — Progress Notes (Signed)
I agree with the assessment and plan as directed by NP .The patient is known to me .   Antonia Jicha, MD  

## 2018-05-14 NOTE — Progress Notes (Signed)
I agree with the assessment and plan as directed by NP .The patient is known to me .   Brennen Camper, MD  

## 2019-04-27 ENCOUNTER — Telehealth: Payer: Self-pay | Admitting: *Deleted

## 2019-04-27 NOTE — Telephone Encounter (Signed)
Due to current COVID 19 pandemic, our office is severely reducing in office visits until further notice, in order to minimize the risk to our patients and healthcare providers.  Pt understands that although there may be some limitations with this type of visit, we will take all precautions to reduce any security or privacy concerns.  Pt understands that this will be treated like an in office visit and we will file with pt's insurance, and there may be a patient responsible charge related to this service.  Sonia Baller, caregiver at [facility] care unit in [place]. Consented to [email or web address] visit.  [email or web address]. Email sent.

## 2019-05-02 ENCOUNTER — Telehealth: Payer: Self-pay | Admitting: Adult Health

## 2019-05-02 ENCOUNTER — Ambulatory Visit (INDEPENDENT_AMBULATORY_CARE_PROVIDER_SITE_OTHER): Payer: Medicare Other | Admitting: Adult Health

## 2019-05-02 ENCOUNTER — Other Ambulatory Visit: Payer: Self-pay

## 2019-05-02 DIAGNOSIS — G309 Alzheimer's disease, unspecified: Secondary | ICD-10-CM

## 2019-05-02 DIAGNOSIS — F0281 Dementia in other diseases classified elsewhere with behavioral disturbance: Secondary | ICD-10-CM | POA: Diagnosis not present

## 2019-05-02 NOTE — Telephone Encounter (Signed)
Patient needs to be scheduled for Telephone visit . Please call Peggy to schedule [phone].

## 2019-05-02 NOTE — Progress Notes (Signed)
PATIENT: [name] DOB: 1928-04-05  REASON FOR VISIT: follow up HISTORY FROM: patient  Virtual Visit via Video Note  I connected with [name] on 05/02/19 at 11:30 AM EDT by a video enabled telemedicine application [facility] and verified that I am speaking with the correct person using two identifiers who was located at their own home.   I discussed the limitations of evaluation and management by telemedicine and the availability of in person appointments. The patient expressed understanding and agreed to proceed.   PATIENT: [name] DOB: 1928-04-05  REASON FOR VISIT: follow up HISTORY FROM: patient  HISTORY OF PRESENT ILLNESS: Today 05/02/19:  [name] is an 85 year old female with a history of memory disturbance.  She returns today for a virtual visit.  She is with her caregiver at the memory facility.  Caregiver reports that she requires assistance with all ADLs.  Reports that she has a good appetite.  Denies any trouble sleeping.  The patient continues to have trouble with agitation.  She has a psychiatrist that visits her daily.  She is currently on Seroquel and Ativan.  Caregiver feels it is not working very well.  She joins me today for virtual visit.  HISTORY 04/28/18 [name] is an 85 year old female with a history of memory disturbance.  She returns today for follow-up.  She is here today with one of her caregivers from the facility.  The caregiver reports that the primary issue they are having his agitation.  He reports that they have a psychiatrist that comes to the facility and they have been adjusting her medications.  The patient does require assistance with all ADLs.  Caregiver reports that she primarily uses a wheelchair for ambulation.  Patient reports good appetite.  Denies any trouble sleeping.  Denies hallucinations.  The patient is currently on Namenda and Exelon capsules.  She returns today for an  evaluation.  REVIEW OF SYSTEMS: Out of a complete 14 system review of symptoms, the patient complains only of the following symptoms, and all other reviewed systems are negative.  See HPI  ALLERGIES: No Known Allergies  HOME MEDICATIONS: Outpatient Medications Prior to Visit  Medication Sig Dispense Refill   aspirin (ADULT ASPIRIN EC LOW STRENGTH) 81 MG EC tablet Take 81 mg by mouth daily.       atorvastatin (LIPITOR) 80 MG tablet TAKE 1 TABLET AT BEDTIME 90 tablet 0   Calcium-Vitamin D 600-125 MG-UNIT TABS Take 1 tablet by mouth daily.       escitalopram (LEXAPRO) 10 MG tablet Take 1 tablet (10 mg total) by mouth at bedtime. 90 tablet 1   LORazepam (ATIVAN) 0.5 MG tablet Take 1 tablet (0.5 mg total) by mouth daily as needed (for agitation). 30 tablet 3   magnesium oxide (MAG-OX) 400 MG tablet Take 400 mg by mouth daily.     memantine (NAMENDA) 10 MG tablet Take 1 tablet (10 mg total) by mouth 2 (two) times daily. 60 tablet 6   Multiple Vitamins-Minerals (CERTAVITE SENIOR/ANTIOXIDANT) TABS Take 1 tablet by mouth daily.     olopatadine (PATANOL) 0.1 % ophthalmic solution 1 drop daily.     QUEtiapine (SEROQUEL) 50 MG tablet TAKE 1 TABLET IN THE MORNING, 1 TABLET AFTER 2 P.M. AND 1 TABLET AT NIGHT (OFFICE VISIT FOR MORE REFILLS) (Patient taking differently: 1 tablet three times daily) 90 tablet 0   rivastigmine (EXELON) 4.5 MG capsule Take 4.5 mg by mouth 2 (two)  times daily.     sennosides-docusate sodium (SENOKOT-S) 8.6-50 MG tablet Take 1 tablet by mouth daily.     triamterene-hydrochlorothiazide (DYAZIDE) 37.5-25 MG per capsule TAKE ONE CAPSULE BY MOUTH EVERY DAY ON MONDAYS, WEDNESDAYS, AND FRIDAYS ONLY AS DIRECTED (Patient taking differently: Take one capsule by mouth twice daily) 90 capsule 0   UNABLE TO FIND Med Name: Gentain viola solution 1% Apply LLE weekly     No facility-administered medications prior to visit.     PAST MEDICAL HISTORY: Past Medical History:    Diagnosis Date   Arthritis    Complications affecting other specified body systems, hypertension    Coronary atherosclerosis of unspecified type of vessel, native or graft    nonobstructive; unspecified site   Localized osteoarthrosis not specified whether primary or secondary, unspecified site    Memory loss    Other and unspecified hyperlipidemia    Other B-complex deficiencies    Unspecified essential hypertension     PAST SURGICAL HISTORY: Past Surgical History:  Procedure Laterality Date   CARDIAC CATHETERIZATION  1992   real a 20% lesion in the LAD    FAMILY HISTORY: Family History  Problem Relation Age of Onset   Bone cancer Father    Heart attack Mother     SOCIAL HISTORY: Social History   Socioeconomic History   Marital status: Married    Spouse name: Charles    Number of children: 4   Years of education: 12   Highest education level: Not on file  Occupational History   Occupation: Retired  Ecologist strain: Not on Pensions consultant insecurity:    Worry: Not on file    Inability: Not on Occupational hygienist needs:    Medical: Not on file    Non-medical: Not on file  Tobacco Use   Smoking status: Never Smoker   Smokeless tobacco: Never Used  Substance and Sexual Activity   Alcohol use: No    Comment: Patient denies    Drug use: No    Comment: Patient denies   Sexual activity: Not on file  Lifestyle   Physical activity:    Days per week: Not on file    Minutes per session: Not on file   Stress: Not on file  Relationships   Social connections:    Talks on phone: Not on file    Gets together: Not on file    Attends religious service: Not on file    Active member of club or organization: Not on file    Attends meetings of clubs or organizations: Not on file    Relationship status: Not on file   Intimate partner violence:    Fear of current or ex partner: Not on file    Emotionally abused: Not on  file    Physically abused: Not on file    Forced sexual activity: Not on file  Other Topics Concern   Not on file  Social History Narrative   Patient lives at:   [facility]   [address]   [place], [place] [address]   Phone: [phone]   Toll Free: [phone]   Fax: [phone]      Patient is retired.    Patient is right handed.    Patient is a high school grad.    Patient has four daughters.   Patient drinks one cup of coffee and one cup of orange soda daily.  PHYSICAL EXAM   Generalized: Well developed, in no acute distress   Neurological examination  Mentation: Alert oriented to person and place.  Not oriented to time.  Follows all commands speech and language fluent Cranial nerve II-XII:  Extraocular movements were full. Facial symmetry noted.Marland Kitchen Uvula tongue midline. Head turning and shoulder shrug  were normal and symmetric. Motor: UTA.  Sensory: UTA Coordination: Cerebellar testing reveals difficulty with finger-nose-finger but it could be d/t comprehension of directions Gait and station: patient is in a wheelchair Reflexes: UTA  DIAGNOSTIC DATA (LABS, IMAGING, TESTING) - I reviewed patient records, labs, notes, testing and imaging myself where available.  Lab Results  Component Value Date   WBC 6.4 03/24/2015   HGB 12.4 03/24/2015   HCT 37.4 03/24/2015   MCV 90.6 03/24/2015   PLT 251 03/24/2015      Component Value Date/Time   NA 137 03/24/2015 1343   K 3.0 (L) 03/24/2015 1343   CL 101 03/24/2015 1343   CO2 26 03/24/2015 1343   GLUCOSE 98 03/24/2015 1343   GLUCOSE 110 (H) 10/19/2006 0813   BUN 16 03/24/2015 1343   CREATININE 1.18 (H) 03/24/2015 1343   CALCIUM 9.1 03/24/2015 1343   PROT 6.4 03/24/2015 1343   ALBUMIN 3.3 (L) 03/24/2015 1343   AST 22 03/24/2015 1343   ALT 18 03/24/2015 1343   ALKPHOS 158 (H) 03/24/2015 1343   BILITOT 1.2 03/24/2015 1343   GFRNONAA 41 (L) 03/24/2015 1343   GFRAA 47 (L)  03/24/2015 1343   Lab Results  Component Value Date   CHOL 132 08/05/2011   HDL 60.90 08/05/2011   LDLCALC 57 08/05/2011   TRIG 71.0 08/05/2011   CHOLHDL 2 08/05/2011   No results found for: HGBA1C Lab Results  Component Value Date   VITAMINB12 >1500 pg/mL (H) 08/21/2009   Lab Results  Component Value Date   TSH 1.16 06/05/2014      ASSESSMENT AND PLAN 85 y.o. year old female  has a past medical history of Arthritis, Complications affecting other specified body systems, hypertension, Coronary atherosclerosis of unspecified type of vessel, native or graft, Localized osteoarthrosis not specified whether primary or secondary, unspecified site, Memory loss, Other and unspecified hyperlipidemia, Other B-complex deficiencies, and Unspecified essential hypertension. here with :  1.  Memory disturbance  We were unable to do an MMSE due to limitations due to virtual visit.  The patient will continue on Namenda and Exelon patch.  They will discuss her agitation with her psychiatrist.  I have advised that I would like to see the patient in the office in 6 months to do formal memory testing.  I advised that if her symptoms worsen or she develops new symptoms they should let us know.  She will follow-up in 6 months or sooner if needed.  I spent 15 minutes with the patient this time was spent reviewing her chart prior to the visit and discussing her plan of care.   Butch Penny, MSN, NP-C 05/02/2019, 11:23 AM [facility] [address] [place], [place] [address] [phone]

## 2019-10-31 ENCOUNTER — Telehealth: Payer: Self-pay | Admitting: Adult Health

## 2019-10-31 NOTE — Telephone Encounter (Signed)
Doxy link sent to link sent to [email or web address] Pt is not able to come in to office due to not feeling well.

## 2019-10-31 NOTE — Telephone Encounter (Signed)
Email came back with an error.

## 2019-11-01 ENCOUNTER — Telehealth (INDEPENDENT_AMBULATORY_CARE_PROVIDER_SITE_OTHER): Payer: Medicare Other | Admitting: Adult Health

## 2019-11-01 DIAGNOSIS — G309 Alzheimer's disease, unspecified: Secondary | ICD-10-CM

## 2019-11-01 DIAGNOSIS — F0281 Dementia in other diseases classified elsewhere with behavioral disturbance: Secondary | ICD-10-CM

## 2019-11-01 NOTE — Progress Notes (Signed)
  [facility] [address] [place] [address] [phone]     Virtual Visit via Telephone Note  I connected with Jeannine Boga on 11/01/19 at 11:00 AM EST by telephone located remotely at [facility] and verified that I am speaking with the correct person using two identifiers who reports being located at nursing facility    Visit scheduled by Facility. RN discussed the limitations, risks, security and privacy concerns of performing an evaluation and management service by video visit and the availability of in person appointments. I also discussed with the patient that there may be a patient responsible charge related to this service. The patient expressed understanding and agreed to proceed.    History of Present Illness:  [name] is a 85 y.o. female who has been followed in this office for memory disturbance was initially scheduled for a video visit.  However while on the video visit the computer continue to freeze.  We continued to talk via the computer but was unable to see a picture.  Apolonio Schneiders the director was with the patient for part of the visit.  She notes that there has been a decline with the patient over time.  The patient requires assistance with all ADLs.  On occasion she does have to be fed.  Her alertness varies according to [place].  She states that it is approximately 50-50.  Some days she is more alert than others.  Apolonio Schneiders feels that she now needs skilled nursing due to the amount of help she is requiring.  They do have a provider that rounds with her at the facility.  Unable to complete a memory test today.  The other aide that is with the patient was less familiar with the patient.  She was unsure about the patient's behavior.  She was also unsure if she is still on Seroquel.   Observations/Objective:    Neurological examination  Mentation: Alert but drowsy.  Oriented to person.   Assessment and Plan:  1.  Memory  disturbance  Unable to check a memory score today.  The patient will remain on Namenda and Exelon.  Advised that they should talk to the patient's primary care about skilled nursing.  The aide voiced understanding.  Also encouraged the aide to follow-up to see if she was on Seroquel.  Patient will follow up in our office in 6 months or sooner if needed.  Follow Up Instructions:  6 months      I discussed the assessment and treatment plan with the patient.  The patient was provided an opportunity to ask questions and all were answered to their satisfaction. The patient agreed with the plan and verbalized an understanding of the instructions.   I provided 12 minutes of non-face-to-face time during this encounter.    Ward Givens NP-C  [facility] [address] [place], [place] [address]  Phone [phone] Fax [phone]

## 2019-11-01 NOTE — Telephone Encounter (Addendum)
I called and spoke to Diane.  She read me back the email link and [email or web address].  Sent another link thru email.

## 2019-12-02 ENCOUNTER — Emergency Department (HOSPITAL_COMMUNITY): Payer: Medicare Other

## 2019-12-02 ENCOUNTER — Other Ambulatory Visit: Payer: Self-pay

## 2019-12-02 ENCOUNTER — Encounter (HOSPITAL_COMMUNITY): Payer: Self-pay

## 2019-12-02 ENCOUNTER — Emergency Department (HOSPITAL_COMMUNITY)
Admission: EM | Admit: 2019-12-02 | Discharge: 2019-12-03 | Disposition: A | Discharge status: Home / Self Care | Payer: Medicare Other | Attending: Emergency Medicine | Admitting: Emergency Medicine

## 2019-12-02 DIAGNOSIS — W19XXXA Unspecified fall, initial encounter: Secondary | ICD-10-CM | POA: Insufficient documentation

## 2019-12-02 DIAGNOSIS — Z043 Encounter for examination and observation following other accident: Secondary | ICD-10-CM | POA: Insufficient documentation

## 2019-12-02 DIAGNOSIS — I10 Essential (primary) hypertension: Secondary | ICD-10-CM | POA: Diagnosis not present

## 2019-12-02 DIAGNOSIS — Y9389 Activity, other specified: Secondary | ICD-10-CM | POA: Insufficient documentation

## 2019-12-02 DIAGNOSIS — Y998 Other external cause status: Secondary | ICD-10-CM | POA: Insufficient documentation

## 2019-12-02 DIAGNOSIS — Z7982 Long term (current) use of aspirin: Secondary | ICD-10-CM | POA: Diagnosis not present

## 2019-12-02 DIAGNOSIS — G309 Alzheimer's disease, unspecified: Secondary | ICD-10-CM | POA: Insufficient documentation

## 2019-12-02 DIAGNOSIS — I2510 Atherosclerotic heart disease of native coronary artery without angina pectoris: Secondary | ICD-10-CM | POA: Insufficient documentation

## 2019-12-02 DIAGNOSIS — Y92129 Unspecified place in nursing home as the place of occurrence of the external cause: Secondary | ICD-10-CM | POA: Insufficient documentation

## 2019-12-02 DIAGNOSIS — Z79899 Other long term (current) drug therapy: Secondary | ICD-10-CM | POA: Diagnosis not present

## 2019-12-02 HISTORY — DX: Dysphagia, unspecified: R13.10

## 2019-12-02 HISTORY — DX: Anemia, unspecified: D64.9

## 2019-12-02 HISTORY — DX: Dementia in other diseases classified elsewhere, unspecified severity, without behavioral disturbance, psychotic disturbance, mood disturbance, and anxiety: F02.80

## 2019-12-02 HISTORY — DX: Cognitive communication deficit: R41.841

## 2019-12-02 NOTE — ED Triage Notes (Signed)
Pt BIB [facility] from a nursing home, pt fell at 10am this morning. Facility PA said it was finem for pt not to be sent out but then facility MD stated she needed to be sent out because she is on eliquis. Pt has a small hematoma under right eye.

## 2019-12-02 NOTE — ED Notes (Signed)
Called ptar at  [phone]

## 2019-12-02 NOTE — ED Provider Notes (Signed)
[facility] Provider Note   CSN: [id number] Arrival date & time: 12/02/19  1858     History Chief Complaint  Patient presents with   [place] MamFall    [name] is a 85 y.o. female with PMH significant for CAD, HTN, Alzheimer's dementia, HLD, and arthritis with history of falls who presents to the ED after sustaining a fall.  Patient has advanced Alzheimer's disease.  She does not know the year and believes that Nancy RobinsonJohn F. [name] is president.  She does not recall why she is in the ER and is denying any current pain or other complaints.  I called her husband who was unaware that she was in the ER but provide me the phone number for her daughter, Nancy Quickam, who told me that evidently she fell and hit her head at her assisted living facility.  I finally received a phone call back from Nancy RiegerLaura at [facility] who does not regularly take care of this patient, but reports that she evidently had a fall this morning and was assessed by the PA on site.  Dr. Eula ListenHussain, her PCP, requested that she go to the ER for head CT given that she is on Eliquis.  I asked her if she remembers falling today and she confirms.  She endorses hitting her head, but denies any loss of consciousness, blurred vision, neurologic deficits, nausea or vomiting, or current headache.  That said, she also repeatedly tells me that she wants to go home to her family and that all of the staff here could "go to hell".  She denies any current pain, discomfort, or other symptoms at this time.  Level 5 caveat due to dementia.  HPI     Past Medical History:  Diagnosis Date   Alzheimer's disease (HCC)    Anemia    Arthritis    Cognitive communication deficit    Complications affecting other specified body systems, hypertension    Coronary atherosclerosis of unspecified type of vessel, native or graft    nonobstructive; unspecified site   Dysphagia    Localized osteoarthrosis not specified  whether primary or secondary, unspecified site    Memory loss    Other and unspecified hyperlipidemia    Other B-complex deficiencies    Unspecified essential hypertension     Patient Active Problem List   Diagnosis Date Noted   Contusion of both eyes 12/27/2013   Alzheimer's disease (HCC) 08/12/2013   Coronary artery disease 07/31/2011   VITAMIN B12 DEFICIENCY 05/15/2009   HYPERLIPIDEMIA 05/10/2009   LOC OSTEOARTHROS NOT SPEC PRIM/SEC UNSPEC SITE 05/10/2009   MEMORY LOSS 05/10/2009   HYPERTENSION NEC 05/10/2009    Past Surgical History:  Procedure Laterality Date   CARDIAC CATHETERIZATION  1992   real a 20% lesion in the LAD     OB History   No obstetric history on file.     Family History  Problem Relation Age of Onset   Bone cancer Father    Heart attack Mother     Social History   Tobacco Use   Smoking status: Never Smoker   Smokeless tobacco: Never Used  Substance Use Topics   Alcohol use: No    Comment: Patient denies    Drug use: No    Comment: Patient denies    Home Medications Prior to Admission medications   Medication Sig Start Date End Date Taking? Authorizing Provider  aspirin (ADULT ASPIRIN EC LOW STRENGTH) 81 MG EC tablet Take 81  mg by mouth daily.      [provider]  atorvastatin (LIPITOR) 80 MG tablet TAKE 1 TABLET AT BEDTIME 06/19/15   Roderick Pee, MD  Calcium-Vitamin D 600-125 MG-UNIT TABS Take 1 tablet by mouth daily.      [provider]  escitalopram (LEXAPRO) 10 MG tablet Take 1 tablet (10 mg total) by mouth at bedtime. 04/28/17   Dohmeier, Porfirio Mylar, MD  LORazepam (ATIVAN) 0.5 MG tablet Take 1 tablet (0.5 mg total) by mouth daily as needed (for agitation). 03/23/15   Huston Foley, MD  magnesium oxide (MAG-OX) 400 MG tablet Take 400 mg by mouth daily.    [provider]  memantine (NAMENDA) 10 MG tablet Take 1 tablet (10 mg total) by mouth 2 (two) times daily. 04/28/17   Dohmeier, Porfirio Mylar, MD    Multiple Vitamins-Minerals (CERTAVITE SENIOR/ANTIOXIDANT) TABS Take 1 tablet by mouth daily.    [provider]  olopatadine (PATANOL) 0.1 % ophthalmic solution 1 drop daily.    [provider]  QUEtiapine (SEROQUEL) 50 MG tablet TAKE 1 TABLET IN THE MORNING, 1 TABLET AFTER 2 P.M. AND 1 TABLET AT NIGHT (OFFICE VISIT FOR MORE REFILLS) Patient taking differently: 1 tablet three times daily 07/23/15   Roderick Pee, MD  rivastigmine (EXELON) 4.5 MG capsule Take 4.5 mg by mouth 2 (two) times daily.    [provider]  sennosides-docusate sodium (SENOKOT-S) 8.6-50 MG tablet Take 1 tablet by mouth daily.    [provider]  triamterene-hydrochlorothiazide (DYAZIDE) 37.5-25 MG per capsule TAKE ONE CAPSULE BY MOUTH EVERY DAY ON MONDAYS, WEDNESDAYS, AND FRIDAYS ONLY AS DIRECTED Patient taking differently: Take one capsule by mouth twice daily 01/25/15   Gordy Savers, MD  UNABLE TO FIND Med Name: Rutherford Guys viola solution 1% Apply LLE weekly    [provider]    Allergies    Patient has no known allergies.  Review of Systems   Review of Systems  Unable to perform ROS: Dementia    Physical Exam Updated Vital Signs BP 140/65 (BP Location: Right Arm)    Pulse 60    Temp 98.4 F (36.9 C) (Oral)    Resp 18    SpO2 96%   Physical Exam Vitals and nursing note reviewed. Exam conducted with a chaperone present.  Constitutional:      Appearance: Normal appearance.  HENT:     Head: Normocephalic.     Comments: Ecchymoses in various stages of healing.  No palpable skull defects.  Nonbleeding.    Nose: Nose normal.  Eyes:     General: No scleral icterus.    Conjunctiva/sclera: Conjunctivae normal.  Cardiovascular:     Rate and Rhythm: Normal rate and regular rhythm.     Pulses: Normal pulses.     Heart sounds: Normal heart sounds.  Pulmonary:     Effort: Pulmonary effort is normal. No respiratory distress.     Breath sounds: Normal breath sounds.  No wheezing or rales.  Musculoskeletal:     Cervical back: Normal range of motion and neck supple.  Skin:    General: Skin is dry.  Neurological:     Mental Status: She is alert.     GCS: GCS eye subscore is 4. GCS verbal subscore is 5. GCS motor subscore is 6.     Comments: Patient is alert and oriented only to herself.  Baseline for patient.  Psychiatric:     Comments: Mildly agitated.      ED Results /  Procedures / Treatments   Labs (all labs ordered are listed, but only abnormal results are displayed) Labs Reviewed - No data to display  EKG EKG Interpretation  Date/Time:  Friday December 02 2019 19:10:41 EST Ventricular Rate:  73 PR Interval:    QRS Duration: 119 QT Interval:  448 QTC Calculation: 448 R Axis:   15 Text Interpretation: Sinus rhythm Paired ventricular premature complexes Short PR interval LAE, consider biatrial enlargement Nonspecific intraventricular conduction delay Low voltage, precordial leads Nonspecific T abnormalities, lateral leads similar to prior4/16 Confirmed by [name] [phone]) on 12/02/2019 7:17:41 PM   Radiology CT Head Wo Contrast  Result Date: 12/02/2019 CLINICAL DATA:  Fall, on blood thinners. EXAM: CT HEAD WITHOUT CONTRAST TECHNIQUE: Contiguous axial images were obtained from the base of the skull through the vertex without intravenous contrast. COMPARISON:  11/08/2019 FINDINGS: Brain: There is atrophy and chronic small vessel disease changes. No acute intracranial abnormality. Specifically, no hemorrhage, hydrocephalus, mass lesion, acute infarction, or significant intracranial injury. Vascular: No hyperdense vessel or unexpected calcification. Skull: No acute calvarial abnormality. Sinuses/Orbits: Visualized paranasal sinuses and mastoids clear. Orbital soft tissues unremarkable. Other: None IMPRESSION: Atrophy, chronic microvascular disease. No acute intracranial abnormality. Electronically Signed   By: Rolm Baptise M.D.   On:  12/02/2019 21:06   CT Cervical Spine Wo Contrast  Result Date: 12/02/2019 CLINICAL DATA:  Fall, neck pain EXAM: CT CERVICAL SPINE WITHOUT CONTRAST TECHNIQUE: Multidetector CT imaging of the cervical spine was performed without intravenous contrast. Multiplanar CT image reconstructions were also generated. COMPARISON:  11/08/2019 FINDINGS: Alignment: Motion degradation throughout the cervical spine. No subluxation. Skull base and vertebrae: Limited study by motion. No visible fracture or focal bone lesion. Soft tissues and spinal canal: No prevertebral fluid or swelling. No visible canal hematoma. Disc levels: Degenerative disc and facet disease. Disc disease is most pronounced at C5-6. Findings stable since prior study. Upper chest: Ground-glass airspace opacities noted in both upper lobes, new since prior study. Other: Aortic arch and great vessel calcifications. IMPRESSION: No acute bony abnormality. Cervical spondylosis. Vague ground-glass opacities in the upper lobes, new since prior study. Cannot exclude infection. Recommend clinical correlation. Electronically Signed   By: Rolm Baptise M.D.   On: 12/02/2019 21:10    Procedures Procedures (including critical care time)  Medications Ordered in ED Medications - No data to display  ED Course  I have reviewed the triage vital signs and the nursing notes.  Pertinent labs & imaging results that were available during my care of the patient were reviewed by me and considered in my medical decision making (see chart for details).  Clinical Course as of Dec 01 2126  Fri Dec 01, 9572  7059 85 year old female with dementia brought in by EMS from her facility after a fall.  Patient unable to give any history.  Is on anticoagulation so we will get a CT head and C-spine.  Disposition per results of testing.   [MB]    Clinical Course User Index [MB] Hayden Rasmussen, MD   MDM Rules/Calculators/A&P                          After obtaining  history from [name] at patient's assisted living facility, will obtain CT without contrast of head and C-spine to evaluate for intracranial or C-spine abnormalities.  According to [name] and patient's daughter, [name], [name] is concerned for intracranial hemorrhage given patient's trauma while on anticoagulation.  EKG relatively unchanged  compared to prior tracings.  CT head imaging reveals no intracranial hemorrhage or other acute intracranial abnormalities.  CT neck demonstrates no acute bony abnormalities.  Imaging reveals vague groundglass opacities in upper lungs, but patient has not been coughing or demonstrating any evidence of increased work of breathing or shortness of breath symptoms.  Will advise her nursing facility to watch for development of the symptoms.  After speaking with [name] and patient's daughter, [name], it is safe to assume that patient is currently at her baseline.  She is adamant that she wants to go home and is denying any pain or other symptoms at this time.  She is hemodynamically stable and her vital signs are all within normal limits.  Patient is obviously a fall risk and [facility] Nursing staff are aware of her repeated falls.  Patient repeatedly is stating that she would like to go home and at this time we feel she is safe for discharge.   [facility] --- [phone] [name] is daughter [phone], [phone]  Final Clinical Impression(s) / ED Diagnoses Final diagnoses:  Fall, initial encounter    Rx / DC Orders ED Discharge Orders    None       [name] 12/02/19 2127    Terrilee Files, MD 12/03/19 1045

## 2019-12-02 NOTE — Discharge Instructions (Addendum)
CT head revealed no intracranial bleed or other abnormalities. CT cervical spine showed no acute bony abnormalities, but did demonstrate upper lobe opacities of uncertain significance. Patient is not demonstrating any increased work of breathing or cough and Pam also denied any recent illness, fevers, cough, or shortness of breath symptoms. Incidental finding and does not require any intervention at this time, but something worth watching.   Patient sounds as though she is at baseline after discussing her behavior and mental status with Jeannene Patella and Mickel Baas.  She is safe for discharge at this time.  Have her return to the ED for any new or worsening symptoms.

## 2020-02-12 ENCOUNTER — Encounter: Payer: Self-pay | Admitting: Internal Medicine

## 2020-02-12 NOTE — Progress Notes (Addendum)
March 1st, 2021 [facility] Palliative Care Consult Note Telephone: [phone]  Fax: [phone]  PATIENT NAME: [name] DOB: 02-15-1928 MRN: [id number] [facility] (previously at [facility])  PRIMARY CARE PROVIDER:   Galvin Proffer, MD  Butch Penny NP [facility])  REFERRING PROVIDER:  Galvin Proffer, MD [address] [place],  [place] [address]  RESPONSIBLE PARTY: (spouse) [name] [phone]. (daughter) [name] [phone], [phone]. (daughter) [phone]. [name] [phone]  ASSESSMENT / RECOMMENDATIONS:  1. Advance Care Planning: A. Directives: [name] (on facility chart/[facility] EMR): DNR/DNI. Full Scope of Medical Intervention. Yes to IVFs and Antibiotics. No to Tube Feeding. B. Goals of Care: Unable to ascertain d/t patient inability to understand question.  2. Cognitive / Functional status: Patient is oriented to self only. Unable to appreciate where she was. Able to name her husband [name]; follows simple commands. Dependent on all ADLs. Staff report intake of 50% of meals. He last weight on 02/02/20 was 172.4 lbs, which was a loss of 16 lbs (8.5% of her body weight) over the prior 2 months. At a height of 5'4" her BMI was 29.6kg/m2.   3. Family Supports: Married to [place], who actually is a patient of mine in the community  4. Follow up Palliative Care Visit: will continue to follow for signs of decline. I'll call her family with today's visit updates.  I spent 60 minutes providing this consultation from 9:30am-10:30am. More than 50% of the time in this consultation was spent coordinating communication.   HISTORY OF PRESENT ILLNESS:  [name] is a 86 y.o. female with PMH significant for CAD, HTN, Alzheimer's dementia, HLD, and arthritis with history of falls 08/2019: ER after fall, large contusion/hematoma forehead and Lperiorbital ecchymosis. No fxs.  11/07/20: hospitalized [facility] hospitalized after fall, with hypernatremia (Na 169) 2/2 dehydration, AKI, fatty liver disease with transaminitis,  -12/02/2019: ER after fall. Head and neck CT no acute changes.  Palliative Care was asked to help address goals of care.   CODE STATUS: DRR  PPS: 30%  HOSPICE ELIGIBILITY/DIAGNOSIS: TBD  PAST MEDICAL HISTORY:  Past Medical History:  Diagnosis Date  . Alzheimer's disease (HCC)   . Anemia   . Arthritis   . Cognitive communication deficit   . Complications affecting other specified body systems, hypertension   . Coronary atherosclerosis of unspecified type of vessel, native or graft    nonobstructive; unspecified site  . Dysphagia   . Localized osteoarthrosis not specified whether primary or secondary, unspecified site   . Memory loss   . Other and unspecified hyperlipidemia   . Other B-complex deficiencies   . Unspecified essential hypertension     SOCIAL HX:  Social History   Tobacco Use  . Smoking status: Never Smoker  . Smokeless tobacco: Never Used  Substance Use Topics  . Alcohol use: No    Comment: Patient denies     ALLERGIES: No Known Allergies   PERTINENT MEDICATIONS:  Outpatient Encounter Medications as of 02/13/2020  Medication Sig  . apixaban (ELIQUIS) 5 MG TABS tablet Take 5 mg by mouth 2 (two) times daily.  . benzonatate (TESSALON) 100 MG capsule Take by mouth 2 (two) times daily as needed for cough.  . docusate sodium (COLACE) 100 MG capsule Take 100 mg by mouth daily.  . ferrous sulfate 325 (65 FE) MG tablet Take 325 mg by mouth 2 (two) times daily with a meal.  .  FLUoxetine (PROZAC) 10 MG tablet Take 10 mg by mouth daily.  Marland Kitchen loratadine (CLARITIN) 10 MG tablet Take 10 mg by mouth daily.  . Melatonin 3 MG TABS Take 3 mg by mouth.  Marland Kitchen OVER THE COUNTER MEDICATION Medpass 157ml bid to increase nutrition intake  . polyethylene glycol (MIRALAX / GLYCOLAX) 17 g packet Take 17 g by mouth daily.  Marland Kitchen valproic acid (DEPAKENE) 250 MG/5ML  solution Take 500 mg by mouth 2 (two) times daily.  . vitamin C (ASCORBIC ACID) 250 MG tablet Take 250 mg by mouth 2 (two) times daily.  Marland Kitchen aspirin (ADULT ASPIRIN EC LOW STRENGTH) 81 MG EC tablet Take 81 mg by mouth daily.    Marland Kitchen atorvastatin (LIPITOR) 80 MG tablet TAKE 1 TABLET AT BEDTIME  . Calcium-Vitamin D 600-125 MG-UNIT TABS Take 1 tablet by mouth daily.    Marland Kitchen escitalopram (LEXAPRO) 10 MG tablet Take 1 tablet (10 mg total) by mouth at bedtime.  Marland Kitchen LORazepam (ATIVAN) 0.5 MG tablet Take 1 tablet (0.5 mg total) by mouth daily as needed (for agitation).  . magnesium oxide (MAG-OX) 400 MG tablet Take 400 mg by mouth daily.  . memantine (NAMENDA) 10 MG tablet Take 1 tablet (10 mg total) by mouth 2 (two) times daily.  . Multiple Vitamins-Minerals (CERTAVITE SENIOR/ANTIOXIDANT) TABS Take 1 tablet by mouth daily.  Marland Kitchen olopatadine (PATANOL) 0.1 % ophthalmic solution 1 drop daily.  . QUEtiapine (SEROQUEL) 50 MG tablet TAKE 1 TABLET IN THE MORNING, 1 TABLET AFTER 2 P.M. AND 1 TABLET AT NIGHT (OFFICE VISIT FOR MORE REFILLS) (Patient taking differently: 1 tablet three times daily)  . rivastigmine (EXELON) 4.5 MG capsule Take 4.5 mg by mouth 2 (two) times daily.  . sennosides-docusate sodium (SENOKOT-S) 8.6-50 MG tablet Take 1 tablet by mouth daily.  Marland Kitchen triamterene-hydrochlorothiazide (DYAZIDE) 37.5-25 MG per capsule TAKE ONE CAPSULE BY MOUTH EVERY DAY ON MONDAYS, WEDNESDAYS, AND FRIDAYS ONLY AS DIRECTED (Patient taking differently: Take one capsule by mouth twice daily)  . UNABLE TO FIND Med Name: Precious Bard viola solution 1% Apply LLE weekly   No facility-administered encounter medications on file as of 02/13/2020.    PHYSICAL EXAM:   General: NAD, frail appearing, lying supine in bed. She is engaging and very sweet. Answers simple questions and follows simple commands. Loose, congested cough; swallows secretions.  Cardiovascular: regular rate and rhythm Pulmonary: clear ant fields Abdomen: soft, nontender, +  bowel sounds GU: no suprapubic tenderness Extremities:mild bilateral LE edema R > L; Upper extremity edema. Bilateral foot drop Skin: no rashes. Nursing reports sacral pressure injury. Neurological: Weakness but otherwise nonfocal  Julianne Handler, NP

## 2020-02-13 ENCOUNTER — Non-Acute Institutional Stay: Payer: Medicare Other | Admitting: Internal Medicine

## 2020-02-13 ENCOUNTER — Encounter: Payer: Self-pay | Admitting: Internal Medicine

## 2020-02-13 ENCOUNTER — Other Ambulatory Visit: Payer: Self-pay

## 2020-02-13 DIAGNOSIS — Z515 Encounter for palliative care: Secondary | ICD-10-CM

## 2020-02-13 DIAGNOSIS — Z7189 Other specified counseling: Secondary | ICD-10-CM

## 2020-02-15 ENCOUNTER — Telehealth: Payer: Self-pay | Admitting: Internal Medicine

## 2020-02-15 NOTE — Telephone Encounter (Signed)
10am: TC to daughter Elita Quick, to update her regarding my visit with Patient on Mon March 1st. Pam mentioned that she and her sisters were able to visit patient at [facility] about 2 weeks earlier. Pam found her mom initially tearful and requesting to go home. Pam was able to successfully redirect patient into a happier time with reminiscences, and ended the visit on a positive note.  Holly Bodily NP-C [phone]

## 2020-06-04 ENCOUNTER — Emergency Department (HOSPITAL_COMMUNITY): Payer: Medicare Other

## 2020-06-04 ENCOUNTER — Other Ambulatory Visit: Payer: Self-pay

## 2020-06-04 ENCOUNTER — Inpatient Hospital Stay (HOSPITAL_COMMUNITY)
Admission: EM | Admit: 2020-06-04 | Discharge: 2020-06-08 | DRG: 689 | Disposition: A | Discharge status: Skilled Nursing Facility | Payer: Medicare Other | Attending: Internal Medicine | Admitting: Internal Medicine

## 2020-06-04 ENCOUNTER — Encounter (HOSPITAL_COMMUNITY): Payer: Self-pay | Admitting: Internal Medicine

## 2020-06-04 DIAGNOSIS — N3000 Acute cystitis without hematuria: Principal | ICD-10-CM | POA: Diagnosis present

## 2020-06-04 DIAGNOSIS — I2510 Atherosclerotic heart disease of native coronary artery without angina pectoris: Secondary | ICD-10-CM | POA: Diagnosis present

## 2020-06-04 DIAGNOSIS — Z86718 Personal history of other venous thrombosis and embolism: Secondary | ICD-10-CM

## 2020-06-04 DIAGNOSIS — Z8249 Family history of ischemic heart disease and other diseases of the circulatory system: Secondary | ICD-10-CM | POA: Diagnosis not present

## 2020-06-04 DIAGNOSIS — G9341 Metabolic encephalopathy: Secondary | ICD-10-CM | POA: Insufficient documentation

## 2020-06-04 DIAGNOSIS — I10 Essential (primary) hypertension: Secondary | ICD-10-CM | POA: Diagnosis present

## 2020-06-04 DIAGNOSIS — R54 Age-related physical debility: Secondary | ICD-10-CM | POA: Diagnosis present

## 2020-06-04 DIAGNOSIS — Z809 Family history of malignant neoplasm, unspecified: Secondary | ICD-10-CM | POA: Diagnosis not present

## 2020-06-04 DIAGNOSIS — R1310 Dysphagia, unspecified: Secondary | ICD-10-CM | POA: Diagnosis present

## 2020-06-04 DIAGNOSIS — E860 Dehydration: Secondary | ICD-10-CM | POA: Diagnosis present

## 2020-06-04 DIAGNOSIS — Z66 Do not resuscitate: Secondary | ICD-10-CM | POA: Diagnosis present

## 2020-06-04 DIAGNOSIS — R001 Bradycardia, unspecified: Secondary | ICD-10-CM | POA: Diagnosis not present

## 2020-06-04 DIAGNOSIS — E538 Deficiency of other specified B group vitamins: Secondary | ICD-10-CM | POA: Diagnosis present

## 2020-06-04 DIAGNOSIS — E876 Hypokalemia: Secondary | ICD-10-CM | POA: Diagnosis not present

## 2020-06-04 DIAGNOSIS — G92 Toxic encephalopathy: Secondary | ICD-10-CM | POA: Diagnosis present

## 2020-06-04 DIAGNOSIS — Z79899 Other long term (current) drug therapy: Secondary | ICD-10-CM | POA: Diagnosis not present

## 2020-06-04 DIAGNOSIS — Z7901 Long term (current) use of anticoagulants: Secondary | ICD-10-CM

## 2020-06-04 DIAGNOSIS — G309 Alzheimer's disease, unspecified: Secondary | ICD-10-CM | POA: Diagnosis present

## 2020-06-04 DIAGNOSIS — Z20822 Contact with and (suspected) exposure to covid-19: Secondary | ICD-10-CM | POA: Diagnosis present

## 2020-06-04 DIAGNOSIS — E870 Hyperosmolality and hypernatremia: Secondary | ICD-10-CM | POA: Diagnosis present

## 2020-06-04 DIAGNOSIS — F0280 Dementia in other diseases classified elsewhere without behavioral disturbance: Secondary | ICD-10-CM

## 2020-06-04 DIAGNOSIS — R251 Tremor, unspecified: Secondary | ICD-10-CM

## 2020-06-04 DIAGNOSIS — M1990 Unspecified osteoarthritis, unspecified site: Secondary | ICD-10-CM | POA: Diagnosis present

## 2020-06-04 DIAGNOSIS — R4182 Altered mental status, unspecified: Secondary | ICD-10-CM | POA: Diagnosis present

## 2020-06-04 DIAGNOSIS — E785 Hyperlipidemia, unspecified: Secondary | ICD-10-CM | POA: Diagnosis present

## 2020-06-04 HISTORY — DX: Metabolic encephalopathy: G93.41

## 2020-06-04 LAB — COMPREHENSIVE METABOLIC PANEL
ALT: 17 U/L (ref 0–44)
AST: 22 U/L (ref 15–41)
Albumin: 2.4 g/dL — ABNORMAL LOW (ref 3.5–5.0)
Alkaline Phosphatase: 94 U/L (ref 38–126)
Anion gap: 9 (ref 5–15)
BUN: 33 mg/dL — ABNORMAL HIGH (ref 8–23)
CO2: 25 mmol/L (ref 22–32)
Calcium: 10 mg/dL (ref 8.9–10.3)
Chloride: 110 mmol/L (ref 98–111)
Creatinine, Ser: 1.23 mg/dL — ABNORMAL HIGH (ref 0.44–1.00)
GFR calc Af Amer: 45 mL/min — ABNORMAL LOW (ref >60)
GFR calc non Af Amer: 39 mL/min — ABNORMAL LOW (ref >60)
Glucose, Bld: 136 mg/dL — ABNORMAL HIGH (ref 70–99)
Potassium: 3.5 mmol/L (ref 3.5–5.1)
Sodium: 144 mmol/L (ref 135–145)
Total Bilirubin: 0.2 mg/dL — ABNORMAL LOW (ref 0.3–1.2)
Total Protein: 6 g/dL — ABNORMAL LOW (ref 6.5–8.1)

## 2020-06-04 LAB — CBC WITH DIFFERENTIAL/PLATELET
Abs Immature Granulocytes: 0.04 10*3/uL (ref 0.00–0.07)
Basophils Absolute: 0 10*3/uL (ref 0.0–0.1)
Basophils Relative: 1 %
Eosinophils Absolute: 0.1 10*3/uL (ref 0.0–0.5)
Eosinophils Relative: 2 %
HCT: 35.2 % — ABNORMAL LOW (ref 36.0–46.0)
Hemoglobin: 10.8 g/dL — ABNORMAL LOW (ref 12.0–15.0)
Immature Granulocytes: 1 %
Lymphocytes Relative: 26 %
Lymphs Abs: 1.7 10*3/uL (ref 0.7–4.0)
MCH: 29.8 pg (ref 26.0–34.0)
MCHC: 30.7 g/dL (ref 30.0–36.0)
MCV: 97.2 fL (ref 80.0–100.0)
Monocytes Absolute: 0.6 10*3/uL (ref 0.1–1.0)
Monocytes Relative: 9 %
Neutro Abs: 4.1 10*3/uL (ref 1.7–7.7)
Neutrophils Relative %: 61 %
Platelets: 285 10*3/uL (ref 150–400)
RBC: 3.62 MIL/uL — ABNORMAL LOW (ref 3.87–5.11)
RDW: 14.6 % (ref 11.5–15.5)
WBC: 6.5 10*3/uL (ref 4.0–10.5)
nRBC: 0 % (ref 0.0–0.2)

## 2020-06-04 LAB — URINALYSIS, ROUTINE W REFLEX MICROSCOPIC
Bilirubin Urine: NEGATIVE
Glucose, UA: NEGATIVE mg/dL
Hgb urine dipstick: NEGATIVE
Ketones, ur: NEGATIVE mg/dL
Nitrite: NEGATIVE
Protein, ur: NEGATIVE mg/dL
Specific Gravity, Urine: 1.014 (ref 1.005–1.030)
pH: 6 (ref 5.0–8.0)

## 2020-06-04 LAB — I-STAT VENOUS BLOOD GAS, ED
Acid-Base Excess: 5 mmol/L — ABNORMAL HIGH (ref 0.0–2.0)
Bicarbonate: 28.9 mmol/L — ABNORMAL HIGH (ref 20.0–28.0)
Calcium, Ion: 1.25 mmol/L (ref 1.15–1.40)
HCT: 30 % — ABNORMAL LOW (ref 36.0–46.0)
Hemoglobin: 10.2 g/dL — ABNORMAL LOW (ref 12.0–15.0)
O2 Saturation: 94 %
Potassium: 3.3 mmol/L — ABNORMAL LOW (ref 3.5–5.1)
Sodium: 147 mmol/L — ABNORMAL HIGH (ref 135–145)
TCO2: 30 mmol/L (ref 22–32)
pCO2, Ven: 39 mmHg — ABNORMAL LOW (ref 44.0–60.0)
pH, Ven: 7.477 — ABNORMAL HIGH (ref 7.250–7.430)
pO2, Ven: 66 mmHg — ABNORMAL HIGH (ref 32.0–45.0)

## 2020-06-04 LAB — MAGNESIUM: Magnesium: 1.6 mg/dL — ABNORMAL LOW (ref 1.7–2.4)

## 2020-06-04 LAB — CBG MONITORING, ED: Glucose-Capillary: 106 mg/dL — ABNORMAL HIGH (ref 70–99)

## 2020-06-04 LAB — POC OCCULT BLOOD, ED: Fecal Occult Bld: NEGATIVE

## 2020-06-04 LAB — LACTIC ACID, PLASMA
Lactic Acid, Venous: 1.6 mmol/L (ref 0.5–1.9)
Lactic Acid, Venous: 0.9 mmol/L (ref 0.5–1.9)

## 2020-06-04 LAB — PHOSPHORUS: Phosphorus: 3.6 mg/dL (ref 2.5–4.6)

## 2020-06-04 LAB — VALPROIC ACID LEVEL: Valproic Acid Lvl: 21 ug/mL — ABNORMAL LOW (ref 50.0–100.0)

## 2020-06-04 LAB — PROTIME-INR
INR: 1.2 (ref 0.8–1.2)
Prothrombin Time: 14.6 seconds (ref 11.4–15.2)

## 2020-06-04 LAB — BRAIN NATRIURETIC PEPTIDE: B Natriuretic Peptide: 126.7 pg/mL — ABNORMAL HIGH (ref 0.0–100.0)

## 2020-06-04 LAB — TROPONIN I (HIGH SENSITIVITY)
Troponin I (High Sensitivity): 14 ng/L (ref <18)
Troponin I (High Sensitivity): 15 ng/L (ref <18)

## 2020-06-04 LAB — TSH: TSH: 4.195 u[IU]/mL (ref 0.350–4.500)

## 2020-06-04 LAB — AMMONIA: Ammonia: 24 umol/L (ref 9–35)

## 2020-06-04 MED ORDER — VALPROIC ACID 250 MG/5ML PO SOLN
500.0000 mg | Freq: Two times a day (BID) | ORAL | Status: DC
  Filled 2020-06-04 (×4): qty 10

## 2020-06-04 MED ORDER — CALCIUM CARBONATE-VITAMIN D 500-200 MG-UNIT PO TABS
1.0000 | ORAL_TABLET | Freq: Every day | ORAL | Status: DC
  Administered 2020-06-06 – 2020-06-08 (×3): 1 via ORAL
  Filled 2020-06-04 (×4): qty 1

## 2020-06-04 MED ORDER — MELATONIN 3 MG PO TABS
3.0000 mg | ORAL_TABLET | Freq: Every day | ORAL | Status: DC
  Administered 2020-06-06 – 2020-06-07 (×2): 3 mg via ORAL
  Filled 2020-06-04 (×5): qty 1

## 2020-06-04 MED ORDER — FERROUS SULFATE 325 (65 FE) MG PO TABS
325.0000 mg | ORAL_TABLET | Freq: Two times a day (BID) | ORAL | Status: DC
  Administered 2020-06-06 – 2020-06-08 (×6): 325 mg via ORAL
  Filled 2020-06-04 (×6): qty 1

## 2020-06-04 MED ORDER — SODIUM CHLORIDE 0.9 % IV SOLN: INTRAVENOUS | Status: DC

## 2020-06-04 MED ORDER — MAGNESIUM OXIDE 400 (241.3 MG) MG PO TABS
400.0000 mg | ORAL_TABLET | Freq: Every day | ORAL | Status: DC
  Administered 2020-06-06 – 2020-06-08 (×3): 400 mg via ORAL
  Filled 2020-06-04 (×3): qty 1

## 2020-06-04 MED ORDER — DOCUSATE SODIUM 100 MG PO CAPS
100.0000 mg | ORAL_CAPSULE | Freq: Every day | ORAL | Status: DC
  Administered 2020-06-06 – 2020-06-08 (×3): 100 mg via ORAL
  Filled 2020-06-04 (×3): qty 1

## 2020-06-04 MED ORDER — HYDRALAZINE HCL 25 MG PO TABS: 25.0000 mg | ORAL_TABLET | Freq: Four times a day (QID) | ORAL | Status: DC | PRN

## 2020-06-04 MED ORDER — MAGNESIUM SULFATE 2 GM/50ML IV SOLN
2.0000 g | Freq: Once | INTRAVENOUS | Status: AC
  Administered 2020-06-04: 2 g via INTRAVENOUS
  Filled 2020-06-04: qty 50

## 2020-06-04 MED ORDER — SODIUM CHLORIDE 0.9 % IV SOLN
1.0000 g | Freq: Once | INTRAVENOUS | Status: AC
  Administered 2020-06-04: 1 g via INTRAVENOUS
  Filled 2020-06-04: qty 10

## 2020-06-04 MED ORDER — ASCORBIC ACID 500 MG PO TABS
250.0000 mg | ORAL_TABLET | Freq: Two times a day (BID) | ORAL | Status: DC
  Administered 2020-06-06 – 2020-06-08 (×5): 250 mg via ORAL
  Filled 2020-06-04 (×5): qty 1

## 2020-06-04 MED ORDER — APIXABAN 2.5 MG PO TABS
2.5000 mg | ORAL_TABLET | Freq: Two times a day (BID) | ORAL | Status: DC
  Filled 2020-06-04 (×3): qty 1

## 2020-06-04 MED ORDER — ADULT MULTIVITAMIN W/MINERALS CH
1.0000 | ORAL_TABLET | Freq: Every day | ORAL | Status: DC
  Administered 2020-06-06 – 2020-06-08 (×3): 1 via ORAL
  Filled 2020-06-04 (×3): qty 1

## 2020-06-04 MED ORDER — POLYETHYLENE GLYCOL 3350 17 G PO PACK
17.0000 g | PACK | Freq: Every day | ORAL | Status: DC
  Administered 2020-06-06 – 2020-06-08 (×3): 17 g via ORAL
  Filled 2020-06-04 (×3): qty 1

## 2020-06-04 MED ORDER — MEMANTINE HCL 10 MG PO TABS
10.0000 mg | ORAL_TABLET | Freq: Two times a day (BID) | ORAL | Status: DC
  Administered 2020-06-06 – 2020-06-08 (×5): 10 mg via ORAL
  Filled 2020-06-04 (×8): qty 1

## 2020-06-04 MED ORDER — ACETAMINOPHEN 325 MG PO TABS: 650.0000 mg | ORAL_TABLET | Freq: Four times a day (QID) | ORAL | Status: DC | PRN

## 2020-06-04 MED ORDER — ACETAMINOPHEN 650 MG RE SUPP: 650.0000 mg | Freq: Four times a day (QID) | RECTAL | Status: DC | PRN

## 2020-06-04 MED ORDER — RIVASTIGMINE TARTRATE 1.5 MG PO CAPS
6.0000 mg | ORAL_CAPSULE | Freq: Two times a day (BID) | ORAL | Status: DC
  Filled 2020-06-04 (×4): qty 4

## 2020-06-04 MED ORDER — CITALOPRAM HYDROBROMIDE 20 MG PO TABS
10.0000 mg | ORAL_TABLET | Freq: Every day | ORAL | Status: DC
  Administered 2020-06-06 – 2020-06-08 (×3): 10 mg via ORAL
  Filled 2020-06-04 (×3): qty 1

## 2020-06-04 MED ORDER — LORATADINE 10 MG PO TABS
10.0000 mg | ORAL_TABLET | Freq: Every day | ORAL | Status: DC
  Administered 2020-06-06 – 2020-06-08 (×3): 10 mg via ORAL
  Filled 2020-06-04 (×3): qty 1

## 2020-06-04 MED ORDER — SODIUM CHLORIDE 0.45 % IV SOLN: INTRAVENOUS | Status: DC

## 2020-06-04 MED ORDER — ASPIRIN EC 81 MG PO TBEC: 81.0000 mg | DELAYED_RELEASE_TABLET | Freq: Every day | ORAL | Status: DC

## 2020-06-04 MED ORDER — SENNOSIDES-DOCUSATE SODIUM 8.6-50 MG PO TABS
1.0000 | ORAL_TABLET | Freq: Every day | ORAL | Status: DC
  Administered 2020-06-06 – 2020-06-08 (×3): 1 via ORAL
  Filled 2020-06-04 (×3): qty 1

## 2020-06-04 MED ORDER — OLOPATADINE HCL 0.1 % OP SOLN
1.0000 [drp] | Freq: Every day | OPHTHALMIC | Status: DC
  Administered 2020-06-06 – 2020-06-08 (×3): 1 [drp] via OPHTHALMIC
  Filled 2020-06-04 (×2): qty 5

## 2020-06-04 MED ORDER — CALCIUM CARBONATE 1250 (500 CA) MG PO TABS
500.0000 mg | ORAL_TABLET | Freq: Every day | ORAL | Status: DC
  Administered 2020-06-06 – 2020-06-08 (×3): 500 mg via ORAL
  Filled 2020-06-04 (×3): qty 1

## 2020-06-04 NOTE — ED Provider Notes (Signed)
[facility] Provider Note   CSN: [id number] Arrival date & time: 06/04/20  1028     History Chief Complaint  Patient presents with  . Altered Mental Status    [name] is a 86 y.o. female.  HPI Did history at this time.  Patient is brought from nursing home with report of altered mental status.  Reportedly patient was seen by the NP this morning and found to be confused or poorly responsive.  By report, the nurse caring for the patient at SNF did not know the patient and what her baseline is.  Patient's medical records indicate Alzheimer's disease.  No other additional history at this time regarding recent health status.  Patient was seen in the emergency department [phone], that note indicates that the patient was verbal but confused consistent with Alzheimer's dementia.  Patient is on Eliquis.  I have reviewed her MAR from nursing home.  Patient's daughter reports that at baseline the patient can be interactive.  She does have severe dementia however at baseline she would repeat sentences back and interact with simple verbal interactions.  I spoke with patient's daughter [name] who is the power of attorney.     Past Medical History:  Diagnosis Date  . Alzheimer's disease (HCC)   . Anemia   . Arthritis   . Cognitive communication deficit   . Complications affecting other specified body systems, hypertension   . Coronary atherosclerosis of unspecified type of vessel, native or graft    nonobstructive; unspecified site  . Dysphagia   . Localized osteoarthrosis not specified whether primary or secondary, unspecified site   . Memory loss   . Other and unspecified hyperlipidemia   . Other B-complex deficiencies   . Unspecified essential hypertension     Patient Active Problem List   Diagnosis Date Noted  . AMS (altered mental status) 06/04/2020  . Contusion of both eyes 12/27/2013  . Alzheimer's disease (HCC) 08/12/2013  .  Coronary artery disease 07/31/2011  . VITAMIN B12 DEFICIENCY 05/15/2009  . HYPERLIPIDEMIA 05/10/2009  . LOC OSTEOARTHROS NOT SPEC PRIM/SEC UNSPEC SITE 05/10/2009  . MEMORY LOSS 05/10/2009  . HYPERTENSION NEC 05/10/2009    Past Surgical History:  Procedure Laterality Date  . CARDIAC CATHETERIZATION  1992   real a 20% lesion in the LAD     OB History   No obstetric history on file.     Family History  Problem Relation Age of Onset  . Bone cancer Father   . Heart attack Mother     Social History   Tobacco Use  . Smoking status: Never Smoker  . Smokeless tobacco: Never Used  Substance Use Topics  . Alcohol use: No    Comment: Patient denies   . Drug use: No    Comment: Patient denies    Home Medications Prior to Admission medications   Medication Sig Start Date End Date Taking? Authorizing Provider  apixaban (ELIQUIS) 5 MG TABS tablet Take 2.5 mg by mouth 2 (two) times daily.    Yes [provider]  aspirin (ADULT ASPIRIN EC LOW STRENGTH) 81 MG EC tablet Take 81 mg by mouth daily.     Yes [provider]  benzonatate (TESSALON) 100 MG capsule Take 100 mg by mouth 3 (three) times daily as needed for cough.    Yes [provider]  calcium carbonate (OSCAL) 1500 (600 Ca) MG TABS tablet Take 600 mg of elemental calcium by mouth daily with breakfast.  Yes [provider]  citalopram (CELEXA) 10 MG tablet Take 10 mg by mouth daily.   Yes [provider]  docusate sodium (COLACE) 100 MG capsule Take 100 mg by mouth daily.   Yes [provider]  ferrous sulfate 325 (65 FE) MG tablet Take 325 mg by mouth 2 (two) times daily with a meal.   Yes [provider]  loratadine (CLARITIN) 10 MG tablet Take 10 mg by mouth daily.   Yes [provider]  Melatonin 3 MG TABS Take 3 mg by mouth at bedtime.    Yes [provider]  memantine (NAMENDA) 10 MG tablet Take 1 tablet (10 mg total) by mouth 2 (two) times  daily. 04/28/17  Yes Dohmeier, Porfirio Mylar, MD  Multiple Vitamins-Minerals (CERTAVITE SENIOR/ANTIOXIDANT) TABS Take 1 tablet by mouth daily.   Yes [provider]  olopatadine (PATANOL) 0.1 % ophthalmic solution Place 1 drop into both eyes daily.    Yes [provider]  OVER THE COUNTER MEDICATION Medpass bid to increase nutrition intake   Yes [provider]  polyethylene glycol (MIRALAX / GLYCOLAX) 17 g packet Take 17 g by mouth daily.   Yes [provider]  rivastigmine (EXELON) 4.5 MG capsule Take 6 mg by mouth 2 (two) times daily.    Yes [provider]  sennosides-docusate sodium (SENOKOT-S) 8.6-50 MG tablet Take 1 tablet by mouth daily.   Yes [provider]  valproic acid (DEPAKENE) 250 MG/5ML solution Take 500 mg by mouth 2 (two) times daily.   Yes [provider]  vitamin C (ASCORBIC ACID) 250 MG tablet Take 250 mg by mouth 2 (two) times daily.   Yes [provider]  atorvastatin (LIPITOR) 80 MG tablet TAKE 1 TABLET AT BEDTIME 06/19/15   Roderick Pee, MD  Calcium-Vitamin D 600-125 MG-UNIT TABS Take 1 tablet by mouth daily.      [provider]  escitalopram (LEXAPRO) 10 MG tablet Take 1 tablet (10 mg total) by mouth at bedtime. 04/28/17   Dohmeier, Porfirio Mylar, MD  FLUoxetine (PROZAC) 10 MG tablet Take 10 mg by mouth daily.    [provider]  LORazepam (ATIVAN) 0.5 MG tablet Take 1 tablet (0.5 mg total) by mouth daily as needed (for agitation). 03/23/15   Huston Foley, MD  magnesium oxide (MAG-OX) 400 MG tablet Take 400 mg by mouth daily.    [provider]  QUEtiapine (SEROQUEL) 50 MG tablet TAKE 1 TABLET IN THE MORNING, 1 TABLET AFTER 2 P.M. AND 1 TABLET AT NIGHT (OFFICE VISIT FOR MORE REFILLS) Patient taking differently: 1 tablet three times daily 07/23/15   Roderick Pee, MD  triamterene-hydrochlorothiazide (DYAZIDE) 37.5-25 MG per capsule TAKE ONE CAPSULE BY MOUTH EVERY DAY ON MONDAYS,  WEDNESDAYS, AND FRIDAYS ONLY AS DIRECTED Patient taking differently: Take one capsule by mouth twice daily 01/25/15   Gordy Savers, MD  UNABLE TO FIND Med Name: Rutherford Guys viola solution 1% Apply LLE weekly    [provider]    Allergies    Patient has no known allergies.  Review of Systems   Review of Systems Level 5 caveat cannot obtain review of systems due to dementia. Physical Exam Updated Vital Signs Temp 98.7 F (37.1 C) (Rectal)   Physical Exam Constitutional:      Comments: Patient is awake.  She seems somewhat tremulous and confused.  She appears to have right gaze deviation however if I go to her left side and call to her  she will slightly turn her head and gaze to me and make a verbal response.  No respiratory distress.  HENT:     Mouth/Throat:     Mouth: Mucous membranes are moist.     Pharynx: Oropharynx is clear.  Eyes:     Comments: Pupils approximately 2 mm.  Patient has predominantly holding eyes to the right gaze but will turn to the left with calling her name.  Cardiovascular:     Rate and Rhythm: Normal rate and regular rhythm.     Pulses: Normal pulses.  Pulmonary:     Effort: Pulmonary effort is normal.     Breath sounds: Normal breath sounds.  Abdominal:     General: There is no distension.     Palpations: Abdomen is soft.     Tenderness: There is no abdominal tenderness. There is no guarding.  Musculoskeletal:     Right lower leg: No edema.     Left lower leg: No edema.     Comments: Significant peripheral edema.  Calves are soft.  Skin:    General: Skin is warm and dry.     Comments: Extensive senile ecchymoses forearms.  Neurological:     Comments: Patient seems confused and intermittently is tremulous and exhibit some clonic jerking activity.  She will focus with voice command.  She also exhibits increased agitation with taking the blankets off of her legs and examining her legs and arms.  She is not communicating with intelligible  speech but at times appears to be trying to form sentences.     ED Results / Procedures / Treatments   Labs (all labs ordered are listed, but only abnormal results are displayed) Labs Reviewed  VALPROIC ACID LEVEL - Abnormal; Notable for the following components:      Result Value   Valproic Acid Lvl 21 (*)    All other components within normal limits  COMPREHENSIVE METABOLIC PANEL - Abnormal; Notable for the following components:   Glucose, Bld 136 (*)    BUN 33 (*)    Creatinine, Ser 1.23 (*)    Total Protein 6.0 (*)    Albumin 2.4 (*)    Total Bilirubin 0.2 (*)    GFR calc non Af Amer 39 (*)    GFR calc Af Amer 45 (*)    All other components within normal limits  BRAIN NATRIURETIC PEPTIDE - Abnormal; Notable for the following components:   B Natriuretic Peptide 126.7 (*)    All other components within normal limits  CBC WITH DIFFERENTIAL/PLATELET - Abnormal; Notable for the following components:   RBC 3.62 (*)    Hemoglobin 10.8 (*)    HCT 35.2 (*)    All other components within normal limits  URINALYSIS, ROUTINE W REFLEX MICROSCOPIC - Abnormal; Notable for the following components:   APPearance HAZY (*)    Leukocytes,Ua SMALL (*)    Bacteria, UA RARE (*)    All other components within normal limits  MAGNESIUM - Abnormal; Notable for the following components:   Magnesium 1.6 (*)    All other components within normal limits  I-STAT VENOUS BLOOD GAS, ED - Abnormal; Notable for the following components:   pH, Ven 7.477 (*)    pCO2, Ven 39.0 (*)    pO2, Ven 66.0 (*)    Bicarbonate 28.9 (*)    Acid-Base Excess 5.0 (*)    Sodium 147 (*)    Potassium 3.3 (*)    HCT 30.0 (*)    Hemoglobin 10.2 (*)  All other components within normal limits  CBG MONITORING, ED - Abnormal; Notable for the following components:   Glucose-Capillary 106 (*)    All other components within normal limits  URINE CULTURE  LACTIC ACID, PLASMA  PROTIME-INR  AMMONIA  PHOSPHORUS  TSH  LACTIC  ACID, PLASMA  BLOOD GAS, VENOUS  CK  POC OCCULT BLOOD, ED  TROPONIN I (HIGH SENSITIVITY)  TROPONIN I (HIGH SENSITIVITY)    EKG None  Radiology CT Head Wo Contrast  Result Date: 06/04/2020 CLINICAL DATA:  Altered mental status (AMS), unclear cause. Additional history provided: Not following commands as usual, more lethargic. EXAM: CT HEAD WITHOUT CONTRAST TECHNIQUE: Contiguous axial images were obtained from the base of the skull through the vertex without intravenous contrast. COMPARISON:  Head CT 12/02/2019, brain MRI 06/13/2014 FINDINGS: Brain: Motion degraded examination, particularly at the level of the vertices. Redemonstrated moderate parenchymal atrophy which somewhat disproportionately affects the medial temporal lobes/hippocampi. Mild patchy hypodensity within the cerebral white matter is nonspecific, but consistent with chronic small vessel ischemic disease. There is no acute intracranial hemorrhage. No acute demarcated cortical infarct is identified. No extra-axial fluid collection. No evidence of intracranial mass. No midline shift. Partially empty sella turcica. Vascular: No hyperdense vessel.  Atherosclerotic calcifications. Skull: Normal. Negative for fracture or focal lesion. Sinuses/Orbits: Visualized orbits show no acute finding. Mild ethmoid sinus mucosal thickening. Bilateral mastoid effusions. IMPRESSION: 1. Motion degraded examination, limiting evaluation. 2. No CT evidence of acute intracranial abnormality. 3. Moderate parenchymal atrophy which somewhat disproportionately affects the medial temporal lobes/hippocampi. 4. Stable, mild chronic small vessel ischemic disease. 5. Mild ethmoid sinus mucosal thickening. 6. Bilateral mastoid effusions. Electronically Signed   By: Jackey Loge DO   On: 06/04/2020 14:41   DG Chest Port 1 View  Result Date: 06/04/2020 CLINICAL DATA:  Altered mental status EXAM: PORTABLE CHEST 1 VIEW COMPARISON:  11/08/2019 chest radiograph. FINDINGS:  Right rotated chest radiograph. Low lung volumes. Stable cardiomediastinal silhouette with top-normal heart size. No pneumothorax. Chronic mild blunting of the left costophrenic angle is unchanged. No right pleural effusion. No pulmonary edema. Mild bibasilar scarring versus atelectasis. No acute consolidative airspace disease. IMPRESSION: 1. Low lung volumes with mild bibasilar scarring versus atelectasis. 2. Chronic mild blunting of the left costophrenic angle, favoring mild pleural-parenchymal scarring. Electronically Signed   By: Delbert Phenix M.D.   On: 06/04/2020 11:49    Procedures Procedures (including critical care time)  Medications Ordered in ED Medications  cefTRIAXone (ROCEPHIN) 1 g in sodium chloride 0.9 % 100 mL IVPB (has no administration in time range)    ED Course  I have reviewed the triage vital signs and the nursing notes.  Pertinent labs & imaging results that were available during my care of the patient were reviewed by me and considered in my medical decision making (see chart for details).  Clinical Course as of Jun 04 1532  Mon Jun 04, 2020  1512 Consult: Reviewed with Dr. Laurence Slate, recommend stat EEG and will do consultation the emergency department.   [MP]  1517 Consult: Dr. Chipper Herb for admission   [MP]    Clinical Course User Index [MP] Arby Barrette, MD   MDM Rules/Calculators/A&P                         15: 28 patient is now much more awake than she had been previously.  She is making words that are intelligible and repeating "shut up".  She is staying consistently  awake at this time.  Currently EEG is being obtained.  Patient's case reviewed with hospitalist for admission.  At this time she appears to have positive urinalysis.  Have added urine culture.  Rocephin initiated.  Change possibly initiated by UTI.  If EEG negative, possible worsening of baseline dementia.     Final Clinical Impression(s) / ED Diagnoses Final diagnoses:  Altered mental  status, unspecified altered mental status type  Tremor  Acute cystitis without hematuria  Alzheimer's dementia without behavioral disturbance, unspecified timing of dementia onset [facility])    Rx / DC Orders ED Discharge Orders    None       Arby Barrette, MD 06/04/20 1533

## 2020-06-04 NOTE — Progress Notes (Signed)
EEG completed, results pending. 

## 2020-06-04 NOTE — ED Triage Notes (Signed)
Pt arrives to ED via [facility] from [facility] after being seen by an NP who stated it was not following commands as well as normal and seem more lethargic. Staff with pt were unfamiliar with pts baseline.

## 2020-06-04 NOTE — H&P (Signed)
History and Physical    [name] ION:[id number] DOB: 10/14/1928 DOA: 06/04/2020  PCP: Galvin ProfferHague, Imran P, MD (Confirm with patient/family/NH records and if not entered, this has to be [facility] point [facility] entry) Patient coming from: NH  I have personally briefly reviewed patient[facility]  Chief Complaint: AMS  HPI: [name] is a 86 y.o. female with medical history significant of  Alzheimer disease, anxiety depression, DVT (diagnosed in November 2020, secondary to immobilization, on Eliquis [facility] HTN, was sent from nursing home for evaluation of sudden change of mentation.  Patient is baseline advanced dementia and agitated now unable to provide any history.  And I tried to reach nursing home x3, but nobody answered the phone.  Patient daughter also provided some little history although they do not live together.  Most history was provided by ER staff. Daughter reported that, at baseline the patient can be interactive, but very moody and emotional. She does have severe dementia and at best she can repeat sentences back and interact with simple verbal interactions.  She cannot take care of herself at baseline.  According to nursing home record there was no fever. ED Course: Patient was found to be very agitated and confused with a constant tremor involving all limbs.  CT head negative  Review of Systems: Unable to obtain patient agitated and confused.  Past Medical History:  Diagnosis Date  . Alzheimer's disease (HCC)   . Anemia   . Arthritis   . Cognitive communication deficit   . Complications affecting other specified body systems, hypertension   . Coronary atherosclerosis of unspecified type of vessel, native or graft    nonobstructive; unspecified site  . Dysphagia   . Localized osteoarthrosis not specified whether primary or secondary, unspecified site   . Memory loss   . Other and unspecified hyperlipidemia   . Other B-complex  deficiencies   . Unspecified essential hypertension     Past Surgical History:  Procedure Laterality Date  . CARDIAC CATHETERIZATION  1992   real a 20% lesion in the LAD     reports that she has never smoked. She has never used smokeless tobacco. She reports that she does not drink alcohol and does not use drugs.  No Known Allergies  Family History  Problem Relation Age of Onset  . Bone cancer Father   . Heart attack Mother      Prior to Admission medications   Medication Sig Start Date End Date Taking? Authorizing Provider  apixaban (ELIQUIS) 5 MG TABS tablet Take 2.5 mg by mouth 2 (two) times daily.    Yes [provider]  aspirin (ADULT ASPIRIN EC LOW STRENGTH) 81 MG EC tablet Take 81 mg by mouth daily.     Yes [provider]  benzonatate (TESSALON) 100 MG capsule Take 100 mg by mouth 3 (three) times daily as needed for cough.    Yes [provider]  calcium carbonate (OSCAL) 1500 (600 Ca) MG TABS tablet Take 600 mg of elemental calcium by mouth daily with breakfast.   Yes [provider]  citalopram (CELEXA) 10 MG tablet Take 10 mg by mouth daily.   Yes [provider]  docusate sodium (COLACE) 100 MG capsule Take 100 mg by mouth daily.   Yes [provider]  ferrous sulfate 325 (65 FE) MG tablet Take 325 mg by mouth 2 (two) times daily with a meal.   Yes [provider]  loratadine (CLARITIN) 10 MG tablet Take 10 mg by mouth daily.   Yes [provider]  Melatonin 3 MG TABS Take 3 mg by mouth at bedtime.    Yes [provider]  memantine (NAMENDA) 10 MG tablet Take 1 tablet (10 mg total) by mouth 2 (two) times daily. 04/28/17  Yes Dohmeier, Porfirio Mylar, MD  Multiple Vitamins-Minerals (CERTAVITE SENIOR/ANTIOXIDANT) TABS Take 1 tablet by mouth daily.   Yes [provider]  olopatadine (PATANOL) 0.1 % ophthalmic solution Place 1 drop into both eyes daily.    Yes [provider]  OVER THE  COUNTER MEDICATION Medpass bid to increase nutrition intake   Yes [provider]  polyethylene glycol (MIRALAX / GLYCOLAX) 17 g packet Take 17 g by mouth daily.   Yes [provider]  rivastigmine (EXELON) 4.5 MG capsule Take 6 mg by mouth 2 (two) times daily.    Yes [provider]  sennosides-docusate sodium (SENOKOT-S) 8.6-50 MG tablet Take 1 tablet by mouth daily.   Yes [provider]  valproic acid (DEPAKENE) 250 MG/5ML solution Take 500 mg by mouth 2 (two) times daily.   Yes [provider]  vitamin C (ASCORBIC ACID) 250 MG tablet Take 250 mg by mouth 2 (two) times daily.   Yes [provider]  atorvastatin (LIPITOR) 80 MG tablet TAKE 1 TABLET AT BEDTIME 06/19/15   Roderick Pee, MD  Calcium-Vitamin D 600-125 MG-UNIT TABS Take 1 tablet by mouth daily.      [provider]  escitalopram (LEXAPRO) 10 MG tablet Take 1 tablet (10 mg total) by mouth at bedtime. 04/28/17   Dohmeier, Porfirio Mylar, MD  FLUoxetine (PROZAC) 10 MG tablet Take 10 mg by mouth daily.    [provider]  LORazepam (ATIVAN) 0.5 MG tablet Take 1 tablet (0.5 mg total) by mouth daily as needed (for agitation). 03/23/15   Huston Foley, MD  magnesium oxide (MAG-OX) 400 MG tablet Take 400 mg by mouth daily.    [provider]  QUEtiapine (SEROQUEL) 50 MG tablet TAKE 1 TABLET IN THE MORNING, 1 TABLET AFTER 2 P.M. AND 1 TABLET AT NIGHT (OFFICE VISIT FOR MORE REFILLS) Patient taking differently: 1 tablet three times daily 07/23/15   Roderick Pee, MD  triamterene-hydrochlorothiazide (DYAZIDE) 37.5-25 MG per capsule TAKE ONE CAPSULE BY MOUTH EVERY DAY ON MONDAYS, WEDNESDAYS, AND FRIDAYS ONLY AS DIRECTED Patient taking differently: Take one capsule by mouth twice daily 01/25/15   Gordy Savers, MD  UNABLE TO FIND Med Name: Rutherford Guys viola solution 1% Apply LLE weekly    [provider]    Physical Exam: Vitals:   06/04/20 1121 06/04/20 1131   Temp: 98.1 F (36.7 C) 98.7 F (37.1 C)  TempSrc: Rectal Rectal    Constitutional: NAD, calm, comfortable Vitals:   06/04/20 1121 06/04/20 1131  Temp: 98.1 F (36.7 C) 98.7 F (37.1 C)  TempSrc: Rectal Rectal   Eyes: PERRL, lids and conjunctivae normal, pupils constricted (cataract?) ENMT: Mucous membranes are dry. Posterior pharynx clear of any exudate or lesions.Normal dentition.  Neck: normal, supple, no masses, no thyromegaly Respiratory: clear to auscultation bilaterally, no wheezing, no crackles. Normal respiratory effort. No accessory muscle use.  Cardiovascular: Regular rate and rhythm, no murmurs / rubs / gallops. No extremity edema. 2+ pedal pulses. No carotid bruits.  Abdomen: no tenderness, no masses palpated. No hepatosplenomegaly. Bowel sounds positive.  Musculoskeletal: no clubbing / cyanosis. No joint deformity upper and lower extremities. Good ROM, no  contractures. Normal muscle tone.  Skin: no rashes, lesions, ulcers. No induration Neurologic: Constant tremors on b/l limbs 3-5 Hz, increased muscle tone and rigidity. Positive clonus and increased knee and ankle reflexes Psychiatric: Agitated and confused.     Labs on Admission: I have personally reviewed following labs and imaging studies  CBC: Recent Labs  Lab 06/04/20 1048 06/04/20 1104  WBC 6.5  --   NEUTROABS 4.1  --   HGB 10.8* 10.2*  HCT 35.2* 30.0*  MCV 97.2  --   PLT 285  --    Basic Metabolic Panel: Recent Labs  Lab 06/04/20 1048 06/04/20 1104  NA 144 147*  K 3.5 3.3*  CL 110  --   CO2 25  --   GLUCOSE 136*  --   BUN 33*  --   CREATININE 1.23*  --   CALCIUM 10.0  --   MG 1.6*  --   PHOS 3.6  --    GFR: CrCl cannot be calculated (Unknown ideal weight.). Liver Function Tests: Recent Labs  Lab 06/04/20 1048  AST 22  ALT 17  ALKPHOS 94  BILITOT 0.2*  PROT 6.0*  ALBUMIN 2.4*   No results for input(s): LIPASE, AMYLASE in the last 168 hours. Recent Labs  Lab 06/04/20 1055    AMMONIA 24   Coagulation Profile: Recent Labs  Lab 06/04/20 1048  INR 1.2   Cardiac Enzymes: No results for input(s): CKTOTAL, CKMB, CKMBINDEX, TROPONINI in the last 168 hours. BNP (last 3 results) No results for input(s): PROBNP in the last 8760 hours. HbA1C: No results for input(s): HGBA1C in the last 72 hours. CBG: Recent Labs  Lab 06/04/20 1027  GLUCAP 106*   Lipid Profile: No results for input(s): CHOL, HDL, LDLCALC, TRIG, CHOLHDL, LDLDIRECT in the last 72 hours. Thyroid Function Tests: Recent Labs    06/04/20 1048  TSH 4.195   Anemia Panel: No results for input(s): VITAMINB12, FOLATE, FERRITIN, TIBC, IRON, RETICCTPCT in the last 72 hours. Urine analysis:    Component Value Date/Time   COLORURINE YELLOW 06/04/2020 1225   APPEARANCEUR HAZY (A) 06/04/2020 1225   LABSPEC 1.014 06/04/2020 1225   PHURINE 6.0 06/04/2020 1225   GLUCOSEU NEGATIVE 06/04/2020 1225   HGBUR NEGATIVE 06/04/2020 1225   HGBUR negative 05/10/2009 1010   BILIRUBINUR NEGATIVE 06/04/2020 1225   KETONESUR NEGATIVE 06/04/2020 1225   PROTEINUR NEGATIVE 06/04/2020 1225   UROBILINOGEN 1.0 12/24/2013 0010   NITRITE NEGATIVE 06/04/2020 1225   LEUKOCYTESUR SMALL (A) 06/04/2020 1225    Radiological Exams on Admission: EEG  Result Date: 06/04/2020 Lora Havens, MD     06/04/2020  4:15 PM Patient Name: KASIAH MANKA MRN: [id number] Epilepsy Attending: Lora Havens Referring Physician/Provider: Dr. Karena Addison Aroor Date: 06/04/2020 Duration: 22.41 minutes Patient history: 86 year old female with Alzheimer's dementia who presented with altered mental status and noted to have intermittent twitching of the whole body.  EEG evaluate for seizures. Level of alertness: Awake AEDs during EEG study: Valproic acid Technical aspects: This EEG study was done with scalp electrodes positioned according to the 10-20 International system of electrode placement. Electrical activity was acquired at a sampling rate of  500Hz  and reviewed with a high frequency filter of 70Hz  and a low frequency filter of 1Hz . EEG data were recorded continuously and digitally stored. Description: No posterior dominant rhythm was seen.  EEG showed continuous generalized 3 to 5 Hz theta-delta slowing.  Patient was noted to have intermittent whole-body nonrhythmic twitching movements.  Concomitant EEG  before, during and after the event did not show any EEG changes suggest seizure.  Hyperventilation and photic stimulation were not performed.   ABNORMALITY -Continuous slow, generalized IMPRESSION: This study is suggestive of moderate diffuse encephalopathy, nonspecific to etiology.  Multiple episodes of nonrhythmic whole-body twitching were noted without concomitant EEG change and were not epileptic.  No seizures or epileptiform discharges were seen throughout the recording. Charlsie Quest   CT Head Wo Contrast  Result Date: 06/04/2020 CLINICAL DATA:  Altered mental status (AMS), unclear cause. Additional history provided: Not following commands as usual, more lethargic. EXAM: CT HEAD WITHOUT CONTRAST TECHNIQUE: Contiguous axial images were obtained from the base of the skull through the vertex without intravenous contrast. COMPARISON:  Head CT 12/02/2019, brain MRI 06/13/2014 FINDINGS: Brain: Motion degraded examination, particularly at the level of the vertices. Redemonstrated moderate parenchymal atrophy which somewhat disproportionately affects the medial temporal lobes/hippocampi. Mild patchy hypodensity within the cerebral white matter is nonspecific, but consistent with chronic small vessel ischemic disease. There is no acute intracranial hemorrhage. No acute demarcated cortical infarct is identified. No extra-axial fluid collection. No evidence of intracranial mass. No midline shift. Partially empty sella turcica. Vascular: No hyperdense vessel.  Atherosclerotic calcifications. Skull: Normal. Negative for fracture or focal lesion.  Sinuses/Orbits: Visualized orbits show no acute finding. Mild ethmoid sinus mucosal thickening. Bilateral mastoid effusions. IMPRESSION: 1. Motion degraded examination, limiting evaluation. 2. No CT evidence of acute intracranial abnormality. 3. Moderate parenchymal atrophy which somewhat disproportionately affects the medial temporal lobes/hippocampi. 4. Stable, mild chronic small vessel ischemic disease. 5. Mild ethmoid sinus mucosal thickening. 6. Bilateral mastoid effusions. Electronically Signed   By: Jackey Loge DO   On: 06/04/2020 14:41   DG Chest Port 1 View  Result Date: 06/04/2020 CLINICAL DATA:  Altered mental status EXAM: PORTABLE CHEST 1 VIEW COMPARISON:  11/08/2019 chest radiograph. FINDINGS: Right rotated chest radiograph. Low lung volumes. Stable cardiomediastinal silhouette with top-normal heart size. No pneumothorax. Chronic mild blunting of the left costophrenic angle is unchanged. No right pleural effusion. No pulmonary edema. Mild bibasilar scarring versus atelectasis. No acute consolidative airspace disease. IMPRESSION: 1. Low lung volumes with mild bibasilar scarring versus atelectasis. 2. Chronic mild blunting of the left costophrenic angle, favoring mild pleural-parenchymal scarring. Electronically Signed   By: Delbert Phenix M.D.   On: 06/04/2020 11:49    EKG: Independently reviewed. Sinus  Assessment/Plan Active Problems:   AMS (altered mental status)  (please populate well all problems here in Problem List. (For example, if patient is on BP meds at home and you resume or decide to hold them, it is a problem that needs to be her. Same for CAD, COPD, HLD and so on)  Acute metabolic encephalopathy -No significant etiology other than a putative UTI.  Antibiotic started, urine culture pending -TSH B12  Tremors/shaking acute onset -It was discussed with patient daughter over the phone, daughter reported that another sibling had a visit to patient last Wednesday in her nursing  home who noticed patient had a new onset of shaking at that time which was not there on prior week, and looks like persist through today. -EEG is negative for acute seizure well patient still having active shaking -She has increased muscle rigidity and tremors, and Clonus and increased knee and ankle reflexes along with worsening of mentation.  Consider these symptoms compatible with Parkinsonian vs Serotonin Syndromes?, but check with pharm tech patient only on one low dose SSRI makes this differential unlikely.  Tried to reach  nursing home several times to find out whether there is any changes/increase of her psy meds, but no one picked up. -Check CK -PT evaluation  Hypernatremia - Signs of dehydration, start half NS.  HTN -BP fluctuates, will start PRN BP meds first  Advanced dementia -DNR, confirmed with daughter  Recent DVT -Eliquis, CT head no bleed   DVT prophylaxis: Eliquis Code Status: DNR Family Communication: Daughter Disposition Plan: Like will need more than 2 midnight hospital stay to stabilize mentation Consults called: Neuro Admission status: Tele   Emeline General MD [facility] Pager [phone]    06/04/2020, 4:41 PM

## 2020-06-04 NOTE — Procedures (Signed)
Patient Name: [name]  MRN: [id number]  Epilepsy Attending: Charlsie Quest  Referring Physician/Provider: Dr. Georgiana Spinner Aroor Date: 06/04/2020 Duration: 22.41 minutes  Patient history: 86 year old female with Alzheimer's dementia who presented with altered mental status and noted to have intermittent twitching of the whole body.  EEG evaluate for seizures.  Level of alertness: Awake  AEDs during EEG study: Valproic acid  Technical aspects: This EEG study was done with scalp electrodes positioned according to the 10-20 International system of electrode placement. Electrical activity was acquired at a sampling rate of 500Hz  and reviewed with a high frequency filter of 70Hz  and a low frequency filter of 1Hz . EEG data were recorded continuously and digitally stored.   Description: No posterior dominant rhythm was seen.  EEG showed continuous generalized 3 to 5 Hz theta-delta slowing.  Patient was noted to have intermittent whole-body nonrhythmic twitching movements.  Concomitant EEG before, during and after the event did not show any EEG changes suggest seizure.  Hyperventilation and photic stimulation were not performed.     ABNORMALITY -Continuous slow, generalized  IMPRESSION: This study is suggestive of moderate diffuse encephalopathy, nonspecific to etiology.  Multiple episodes of nonrhythmic whole-body twitching were noted without concomitant EEG change and were not epileptic.  No seizures or epileptiform discharges were seen throughout the recording.  Jonatan Wilsey 

## 2020-06-04 NOTE — Consult Note (Addendum)
NEUROLOGY CONSULT  Reason for Consult: AMS Referring Physician: Dr. Donnald Garre  CC: "leave me alone"; denies any pain or h/a  HPI: [name] is an 86 y.o. female who is from a SNF with Alzheimer disease, cognitive communication deficit, and unable to given any information currently and has no complaint except for cursing at staff to leave her alone. She seems to wax/wane with lethargy and then agitation.  Per Dr. Donnald Garre who spoke to pts daughter, at baseline the patient can be interactive, but does get angry easily.  She does have severe dementia and at best she can repeat sentences back and interact with simple verbal interactions.   Past Medical History Past Medical History:  Diagnosis Date  . Alzheimer's disease (HCC)   . Anemia   . Arthritis   . Cognitive communication deficit   . Complications affecting other specified body systems, hypertension   . Coronary atherosclerosis of unspecified type of vessel, native or graft    nonobstructive; unspecified site  . Dysphagia   . Localized osteoarthrosis not specified whether primary or secondary, unspecified site   . Memory loss   . Other and unspecified hyperlipidemia   . Other B-complex deficiencies   . Unspecified essential hypertension     Past Surgical History Past Surgical History:  Procedure Laterality Date  . CARDIAC CATHETERIZATION  1992   real a 20% lesion in the LAD    Family History Family History  Problem Relation Age of Onset  . Bone cancer Father   . Heart attack Mother     Social History    reports that she has never smoked. She has never used smokeless tobacco. She reports that she does not drink alcohol and does not use drugs.  Allergies No Known Allergies  Home Medications (Not in a hospital admission)   Hospital Medications    ROS: Unable d/t demential; denies any pain or headache  Physical Examination: Vitals:   06/04/20 1121 06/04/20 1131  Temp: 98.1 F (36.7 C) 98.7 F (37.1 C)   TempSrc: Rectal Rectal    General - elderly, disheveled, angry and agitated with any attempt to examin Heart - Regular rate and rhythm - no murmer Lungs - Clear to auscultation Abdomen - Soft - non tender Extremities - Distal pulses intact - no edema Skin - Warm and dry, flaking skin  Neurologic Examination:   Mental Status:  Alert, she only curses and yells, "leave me alone". Did not follow commands. Speech is fluent, mild dysarthria, but poor dentition is noted and she is missing entire bottom teeth. She refuses and pulls away and swats at me when exam was attempted. Thus this is limited to mostly observation. Face is symmetric, pupils are reactive, she tracks me in room on both sides. Freely moves both arm against gravity and quite strongly pulls and pushes me away. Lateral movement seen in both legs. No focal deficits appreciated. She does have a tremor at rest in both arms.      LABORATORY STUDIES:  Basic Metabolic Panel: Recent Labs  Lab 06/04/20 1048 06/04/20 1104  NA 144 147*  K 3.5 3.3*  CL 110  --   CO2 25  --   GLUCOSE 136*  --   BUN 33*  --   CREATININE 1.23*  --   CALCIUM 10.0  --   MG 1.6*  --   PHOS 3.6  --     Liver Function Tests: Recent Labs  Lab 06/04/20 1048  AST 22  ALT  17  ALKPHOS 94  BILITOT 0.2*  PROT 6.0*  ALBUMIN 2.4*   No results for input(s): LIPASE, AMYLASE in the last 168 hours. Recent Labs  Lab 06/04/20 1055  AMMONIA 24    CBC: Recent Labs  Lab 06/04/20 1048 06/04/20 1104  WBC 6.5  --   NEUTROABS 4.1  --   HGB 10.8* 10.2*  HCT 35.2* 30.0*  MCV 97.2  --   PLT 285  --     Cardiac Enzymes: No results for input(s): CKTOTAL, CKMB, CKMBINDEX, TROPONINI in the last 168 hours.  BNP: Invalid input(s): POCBNP  CBG: Recent Labs  Lab 06/04/20 1027  GLUCAP 106*    Microbiology:   Coagulation Studies: Recent Labs    06/04/20 1048  LABPROT 14.6  INR 1.2    Urinalysis:  Recent Labs  Lab 06/04/20 1225   COLORURINE YELLOW  LABSPEC 1.014  PHURINE 6.0  GLUCOSEU NEGATIVE  HGBUR NEGATIVE  BILIRUBINUR NEGATIVE  KETONESUR NEGATIVE  PROTEINUR NEGATIVE  NITRITE NEGATIVE  LEUKOCYTESUR SMALL*    Lipid Panel:     Component Value Date/Time   CHOL 132 08/05/2011 0916   TRIG 71.0 08/05/2011 0916   TRIG 112 10/19/2006 0813   HDL 60.90 08/05/2011 0916   CHOLHDL 2 08/05/2011 0916   VLDL 14.2 08/05/2011 0916   LDLCALC 57 08/05/2011 0916    HgbA1C:  No results found for: HGBA1C  Urine Drug Screen:      Component Value Date/Time   LABOPIA NONE DETECTED 03/24/2015 1413   COCAINSCRNUR NONE DETECTED 03/24/2015 1413   LABBENZ NONE DETECTED 03/24/2015 1413   AMPHETMU NONE DETECTED 03/24/2015 1413   THCU NONE DETECTED 03/24/2015 1413   LABBARB NONE DETECTED 03/24/2015 1413     Alcohol Level:  No results for input(s): ETH in the last 168 hours.  Miscellaneous labs:  EKG  EKG  IMAGING: CT Head Wo Contrast  Result Date: 06/04/2020 CLINICAL DATA:  Altered mental status (AMS), unclear cause. Additional history provided: Not following commands as usual, more lethargic. EXAM: CT HEAD WITHOUT CONTRAST TECHNIQUE: Contiguous axial images were obtained from the base of the skull through the vertex without intravenous contrast. COMPARISON:  Head CT 12/02/2019, brain MRI 06/13/2014 FINDINGS: Brain: Motion degraded examination, particularly at the level of the vertices. Redemonstrated moderate parenchymal atrophy which somewhat disproportionately affects the medial temporal lobes/hippocampi. Mild patchy hypodensity within the cerebral white matter is nonspecific, but consistent with chronic small vessel ischemic disease. There is no acute intracranial hemorrhage. No acute demarcated cortical infarct is identified. No extra-axial fluid collection. No evidence of intracranial mass. No midline shift. Partially empty sella turcica. Vascular: No hyperdense vessel.  Atherosclerotic calcifications. Skull:  Normal. Negative for fracture or focal lesion. Sinuses/Orbits: Visualized orbits show no acute finding. Mild ethmoid sinus mucosal thickening. Bilateral mastoid effusions. IMPRESSION: 1. Motion degraded examination, limiting evaluation. 2. No CT evidence of acute intracranial abnormality. 3. Moderate parenchymal atrophy which somewhat disproportionately affects the medial temporal lobes/hippocampi. 4. Stable, mild chronic small vessel ischemic disease. 5. Mild ethmoid sinus mucosal thickening. 6. Bilateral mastoid effusions. Electronically Signed   By: Kellie Simmering DO   On: 06/04/2020 14:41   DG Chest Port 1 View  Result Date: 06/04/2020 CLINICAL DATA:  Altered mental status EXAM: PORTABLE CHEST 1 VIEW COMPARISON:  11/08/2019 chest radiograph. FINDINGS: Right rotated chest radiograph. Low lung volumes. Stable cardiomediastinal silhouette with top-normal heart size. No pneumothorax. Chronic mild blunting of the left costophrenic angle is unchanged. No right pleural effusion. No pulmonary edema.  Mild bibasilar scarring versus atelectasis. No acute consolidative airspace disease. IMPRESSION: 1. Low lung volumes with mild bibasilar scarring versus atelectasis. 2. Chronic mild blunting of the left costophrenic angle, favoring mild pleural-parenchymal scarring. Electronically Signed   By: Delbert Phenix M.D.   On: 06/04/2020 11:49     Assessment/Plan: 86yr old with severe dementia who was brought to the ER for wax/waning AMS. CTH neg. Ddx: seizures, worsening baseline severe dementia, toxic-metabolic encephalopathy, underlying infection.   PLAN: Stat EEG to r/o status MRI brain if able can be considered, but pt will likely need sedation to achieve this as she currently in not directable and agitated; also doubt this will have much yeild.  Supportive care  Attending neurologist's note to follow Desiree Metzger-Cihelka, ARNP-C, ANVP-BC Pager: [phone]   NEUROHOSPITALIST ADDENDUM Performed a face to  face diagnostic evaluation.   I have reviewed the contents of history and physical exam as documented by PA/ARNP/Resident and agree with above documentation.  I have discussed and formulated the above plan as documented. Edits to the note have been made as needed.  86 year old female with advanced dementia lives in a skilled nursing facility presents to the emergency department with waxing waning mental status.  Noted to have bilateral upper extremity tremors and to be lethargic- On examination patient is arousable, does not make comprehensible speech.  Does not follow any commands.  Does move all 4 extremities to noxious stimulus to pain.  Work-up included Stat EEG negative for seizures.  Subclinical status CT head no acute findings Hold serotonin medications] Evaluate for infectious and metabolic conditions MRI brain if no other explanation for altered mental status    Georgiana Spinner Rhylee Pucillo MD [facility] [phone]   If 7pm to 7am, please call on call as listed on AMION.

## 2020-06-04 NOTE — ED Notes (Signed)
Floor coverage APP page d/t pt failing swallow screen

## 2020-06-05 ENCOUNTER — Other Ambulatory Visit: Payer: Self-pay

## 2020-06-05 ENCOUNTER — Encounter (HOSPITAL_COMMUNITY): Payer: Self-pay | Admitting: Internal Medicine

## 2020-06-05 DIAGNOSIS — G9341 Metabolic encephalopathy: Secondary | ICD-10-CM

## 2020-06-05 DIAGNOSIS — E538 Deficiency of other specified B group vitamins: Secondary | ICD-10-CM

## 2020-06-05 LAB — MRSA PCR SCREENING: MRSA by PCR: NEGATIVE

## 2020-06-05 LAB — SARS CORONAVIRUS 2 BY RT PCR (HOSPITAL ORDER, PERFORMED IN ~~LOC~~ HOSPITAL LAB): SARS Coronavirus 2: NEGATIVE

## 2020-06-05 MED ORDER — APIXABAN 2.5 MG PO TABS
2.5000 mg | ORAL_TABLET | Freq: Two times a day (BID) | ORAL | Status: DC
  Filled 2020-06-05: qty 1

## 2020-06-05 MED ORDER — SODIUM CHLORIDE 0.9 % IV SOLN: INTRAVENOUS | Status: AC

## 2020-06-05 MED ORDER — HEPARIN SODIUM (PORCINE) 5000 UNIT/ML IJ SOLN
5000.0000 [IU] | Freq: Three times a day (TID) | INTRAMUSCULAR | Status: DC
  Administered 2020-06-05 – 2020-06-06 (×2): 5000 [IU] via SUBCUTANEOUS
  Filled 2020-06-05 (×2): qty 1

## 2020-06-05 MED ORDER — ASPIRIN 81 MG PO CHEW: 81.0000 mg | CHEWABLE_TABLET | Freq: Every day | ORAL | Status: DC

## 2020-06-05 NOTE — ED Notes (Signed)
Pt is resting comfortably, no signs of distress noted at this time.

## 2020-06-05 NOTE — Progress Notes (Signed)
CSW spoke with Wille Celeste at [facility] regarding this patient. Wille Celeste reports the patient is long term at the facility and can return once medically stable.  Edwin Dada, MSW, LCSW-A Transitions of Care  Clinical Social Worker  [facility] Emergency Departments  Medical ICU [phone]

## 2020-06-05 NOTE — ED Notes (Signed)
Help get patient over to a hospital bed patient is now straighten up in the bed with call bell in reach

## 2020-06-05 NOTE — ED Notes (Signed)
Report called to [facility] RN

## 2020-06-05 NOTE — Progress Notes (Signed)
[facility] PROGRESS NOTE    Progress Note  [name]  ZOX:[id number] DOB: January 16, 1928 DOA: 06/04/2020 PCP: Galvin Proffer, MD     Brief Narrative:   [name] is an 86 y.o. female past medical history significant for Alzheimer's dementia, anxiety DVT diagnosed in November 2020 on Eliquis, essential hypertension was sent from a nursing home for sudden onset of altered mental status.  The staff found her very agitated and confused with constant movement and tremors so she was sent to the ED.  Assessment/Plan:   Acute metabolic encephalopathy: UA does not appear to be infected, FOBT is negative, TSH is 4.3, Depakote level is 21 which is below the therapeutic range. SARS-CoV-2 PCR is negative. Chest x-ray showed no acute findings. CT scan of the head showed no acute abnormalities. UA does not appear to be infected, discontinue IV antibiotics. Were unsuccessful contacting the family.  Vitamin B 12 deficiency: B12 is pending continue oral repletion.  Alzheimer's disease (HCC) Continue current home regimen   DVT prophylaxis: lovenox Family Communication:daughter Status is: Inpatient  Remains inpatient appropriate because:Unsafe d/c plan   Dispo: The patient is from: SNF              Anticipated d/c is to: SNF              Anticipated d/c date is: 2 days              Patient currently is not medically stable to d/c.   Code Status:     Code Status Orders  (From admission, onward)         Start     Ordered   06/04/20 1711  Do not attempt resuscitation (DNR)  Continuous       Question Answer Comment  In the event of cardiac or respiratory ARREST Do not call a "code blue"   In the event of cardiac or respiratory ARREST Do not perform Intubation, CPR, defibrillation or ACLS   In the event of cardiac or respiratory ARREST Use medication by any route, position, wound care, and other measures to relive pain and suffering. May use oxygen, suction and manual  treatment of airway obstruction as needed for comfort.      06/04/20 1710        Code Status History    Date Active Date Inactive Code Status Order ID Comments User Context   06/04/2020 1610 06/04/2020 1710 Full Code [id number]  Emeline General, MD ED   03/24/2015 1642 03/25/2015 1430 Full Code [id number]  [facility] ED   Advance Care Planning Activity        IV Access:    Peripheral IV   Procedures and diagnostic studies:   EEG  Result Date: 06/04/2020 Charlsie Quest, MD     06/04/2020  4:15 PM Patient Name: [name] MRN: [id number] Epilepsy Attending: Charlsie Quest Referring Physician/Provider: Dr. Georgiana Spinner Aroor Date: 06/04/2020 Duration: 22.41 minutes Patient history: 86 year old female with Alzheimer's dementia who presented with altered mental status and noted to have intermittent twitching of the whole body.  EEG evaluate for seizures. Level of alertness: Awake AEDs during EEG study: Valproic acid Technical aspects: This EEG study was done with scalp electrodes positioned according to the 10-20 International system of electrode placement. Electrical activity was acquired at a sampling rate of 500Hz  and reviewed with a high frequency filter of 70Hz  and a low frequency filter of 1Hz . EEG data were recorded continuously and digitally stored.  Description: No posterior dominant rhythm was seen.  EEG showed continuous generalized 3 to 5 Hz theta-delta slowing.  Patient was noted to have intermittent whole-body nonrhythmic twitching movements.  Concomitant EEG before, during and after the event did not show any EEG changes suggest seizure.  Hyperventilation and photic stimulation were not performed.   ABNORMALITY -Continuous slow, generalized IMPRESSION: This study is suggestive of moderate diffuse encephalopathy, nonspecific to etiology.  Multiple episodes of nonrhythmic whole-body twitching were noted without concomitant EEG change and were not epileptic.  No seizures or  epileptiform discharges were seen throughout the recording. Charlsie Quest   CT Head Wo Contrast  Result Date: 06/04/2020 CLINICAL DATA:  Altered mental status (AMS), unclear cause. Additional history provided: Not following commands as usual, more lethargic. EXAM: CT HEAD WITHOUT CONTRAST TECHNIQUE: Contiguous axial images were obtained from the base of the skull through the vertex without intravenous contrast. COMPARISON:  Head CT 12/02/2019, brain MRI 06/13/2014 FINDINGS: Brain: Motion degraded examination, particularly at the level of the vertices. Redemonstrated moderate parenchymal atrophy which somewhat disproportionately affects the medial temporal lobes/hippocampi. Mild patchy hypodensity within the cerebral white matter is nonspecific, but consistent with chronic small vessel ischemic disease. There is no acute intracranial hemorrhage. No acute demarcated cortical infarct is identified. No extra-axial fluid collection. No evidence of intracranial mass. No midline shift. Partially empty sella turcica. Vascular: No hyperdense vessel.  Atherosclerotic calcifications. Skull: Normal. Negative for fracture or focal lesion. Sinuses/Orbits: Visualized orbits show no acute finding. Mild ethmoid sinus mucosal thickening. Bilateral mastoid effusions. IMPRESSION: 1. Motion degraded examination, limiting evaluation. 2. No CT evidence of acute intracranial abnormality. 3. Moderate parenchymal atrophy which somewhat disproportionately affects the medial temporal lobes/hippocampi. 4. Stable, mild chronic small vessel ischemic disease. 5. Mild ethmoid sinus mucosal thickening. 6. Bilateral mastoid effusions. Electronically Signed   By: Jackey Loge DO   On: 06/04/2020 14:41   DG Chest Port 1 View  Result Date: 06/04/2020 CLINICAL DATA:  Altered mental status EXAM: PORTABLE CHEST 1 VIEW COMPARISON:  11/08/2019 chest radiograph. FINDINGS: Right rotated chest radiograph. Low lung volumes. Stable cardiomediastinal  silhouette with top-normal heart size. No pneumothorax. Chronic mild blunting of the left costophrenic angle is unchanged. No right pleural effusion. No pulmonary edema. Mild bibasilar scarring versus atelectasis. No acute consolidative airspace disease. IMPRESSION: 1. Low lung volumes with mild bibasilar scarring versus atelectasis. 2. Chronic mild blunting of the left costophrenic angle, favoring mild pleural-parenchymal scarring. Electronically Signed   By: Delbert Phenix M.D.   On: 06/04/2020 11:49     Medical Consultants:    None.  Anti-Infectives:   cefepime  Subjective:    [name] completely confused,  complaining of hurting everywhere.  Objective:    Vitals:   06/05/20 0330 06/05/20 0406 06/05/20 0610 06/05/20 0631  BP: 111/85 (!) 121/91 112/63 (!) 123/97  Pulse: (!) 55 (!) 49 (!) 48 (!) 50  Resp: 14 15 17 16   Temp:      TempSrc:      SpO2: 99% 97% 97% 98%  Weight:      Height:       SpO2: 98 %   Intake/Output Summary (Last 24 hours) at 06/05/2020 0830 Last data filed at 06/04/2020 1804 Gross per 24 hour  Intake 100 ml  Output --  Net 100 ml   Filed Weights   06/04/20 1753  Weight: 98.4 kg    Exam: General exam: In no acute distress. Respiratory system: Good air movement and clear to  auscultation. Cardiovascular system: S1 & S2 heard, RRR. No JVD. Gastrointestinal system: Abdomen is nondistended, soft and nontender.  Extremities: No pedal edema. Skin: No rashes, lesions or ulcers Psychiatry: Judgment and insight appear poor   Data Reviewed:    Labs: Basic Metabolic Panel: Recent Labs  Lab 06/04/20 1048 06/04/20 1104  NA 144 147*  K 3.5 3.3*  CL 110  --   CO2 25  --   GLUCOSE 136*  --   BUN 33*  --   CREATININE 1.23*  --   CALCIUM 10.0  --   MG 1.6*  --   PHOS 3.6  --    GFR Estimated Creatinine Clearance: 34.6 mL/min (A) (by C-G formula based on SCr of 1.23 mg/dL (H)). Liver Function Tests: Recent Labs  Lab 06/04/20 1048   AST 22  ALT 17  ALKPHOS 94  BILITOT 0.2*  PROT 6.0*  ALBUMIN 2.4*   No results for input(s): LIPASE, AMYLASE in the last 168 hours. Recent Labs  Lab 06/04/20 1055  AMMONIA 24   Coagulation profile Recent Labs  Lab 06/04/20 1048  INR 1.2   COVID-19 Labs  No results for input(s): DDIMER, FERRITIN, LDH, CRP in the last 72 hours.  Lab Results  Component Value Date   SARSCOV2NAA NEGATIVE 06/04/2020    CBC: Recent Labs  Lab 06/04/20 1048 06/04/20 1104  WBC 6.5  --   NEUTROABS 4.1  --   HGB 10.8* 10.2*  HCT 35.2* 30.0*  MCV 97.2  --   PLT 285  --    Cardiac Enzymes: No results for input(s): CKTOTAL, CKMB, CKMBINDEX, TROPONINI in the last 168 hours. BNP (last 3 results) No results for input(s): PROBNP in the last 8760 hours. CBG: Recent Labs  Lab 06/04/20 1027  GLUCAP 106*   D-Dimer: No results for input(s): DDIMER in the last 72 hours. Hgb A1c: No results for input(s): HGBA1C in the last 72 hours. Lipid Profile: No results for input(s): CHOL, HDL, LDLCALC, TRIG, CHOLHDL, LDLDIRECT in the last 72 hours. Thyroid function studies: Recent Labs    06/04/20 1048  TSH 4.195   Anemia work up: No results for input(s): VITAMINB12, FOLATE, FERRITIN, TIBC, IRON, RETICCTPCT in the last 72 hours. Sepsis Labs: Recent Labs  Lab 06/04/20 1048 06/04/20 1430  WBC 6.5  --   LATICACIDVEN 1.6 0.9   Microbiology Recent Results (from the past 240 hour(s))  SARS Coronavirus 2 by RT PCR (hospital order, performed in [facility] hospital lab) Nasopharyngeal Nasopharyngeal Swab     Status: None   Collection Time: 06/04/20 11:20 PM   Specimen: Nasopharyngeal Swab  Result Value Ref Range Status   SARS Coronavirus 2 NEGATIVE NEGATIVE Final    Comment: (NOTE) SARS-CoV-2 target nucleic acids are NOT DETECTED.  The SARS-CoV-2 RNA is generally detectable in upper and lower respiratory specimens during the acute phase of infection. The lowest concentration of SARS-CoV-2  viral copies this assay can detect is 250 copies / mL. A negative result does not preclude SARS-CoV-2 infection and should not be used as the sole basis for treatment or other patient management decisions.  A negative result may occur with improper specimen collection / handling, submission of specimen other than nasopharyngeal swab, presence of viral mutation(s) within the areas targeted by this assay, and inadequate number of viral copies (<250 copies / mL). A negative result must be combined with clinical observations, patient history, and epidemiological information.  Fact Sheet for Patients:   BoilerBrush.com.cy  Fact Sheet for  Healthcare Providers: BankingDealers.co.za  This test is not yet approved or  cleared by the [place] and has been authorized for detection and/or diagnosis of SARS-CoV-2 by FDA under an Emergency Use Authorization (EUA).  This EUA will remain in effect (meaning this test can be used) for the duration of the COVID-19 declaration under Section 564(b)(1) of the Act, 21 U.S.C. section 360bbb-3(b)(1), unless the authorization is terminated or revoked sooner.  Performed at [facility], [place] [address] [place], [place] [address]      Medications:   . apixaban  2.5 mg Oral BID  . vitamin C  250 mg Oral BID  . aspirin EC  81 mg Oral Daily  . calcium carbonate  500 mg of elemental calcium Oral Q breakfast  . calcium-vitamin D  1 tablet Oral Daily  . citalopram  10 mg Oral Daily  . docusate sodium  100 mg Oral Daily  . ferrous sulfate  325 mg Oral BID WC  . loratadine  10 mg Oral Daily  . magnesium oxide  400 mg Oral Daily  . melatonin  3 mg Oral QHS  . memantine  10 mg Oral BID  . multivitamin with minerals  1 tablet Oral Daily  . olopatadine  1 drop Both Eyes Daily  . polyethylene glycol  17 g Oral Daily  . rivastigmine  6 mg Oral BID  . senna-docusate  1 tablet Oral Daily  . valproic  acid  500 mg Oral BID   Continuous Infusions: . sodium chloride 100 mL/hr at 06/05/20 0420      LOS: 1 day   Charlynne Cousins  [facility]  06/05/2020, 8:30 AM

## 2020-06-05 NOTE — Progress Notes (Signed)
Patient trasfered from ED to [phone] via stretcher; alert and disoriented x 4; no S/S of acute distress; IV saline locked in LH and fluids running in left wrist; skin - redness blanchable on buttocks; skin tear on RUE; brusing on RLE and bilateral arms; scab on LUE. No family at bedside; fall risk precautions in place. Will continue to monitor the patient.

## 2020-06-05 NOTE — Evaluation (Signed)
Physical Therapy Evaluation and Discharge Patient Details Name: [name] MRN: [id number] DOB: September 28, 1928 Today's Date: 06/05/2020   History of Present Illness  Pt is a 86 y/o female admitted from SNF secondary to AMS. Workup pending. PMH includes dementia and HTN.   Clinical Impression  Pt admitted secondary to problem above with deficits below. Pt very resistive to any mobility and required total A +2 for basic bed mobility tasks. Pt with advanced dementia at baseline and would yell profanities throughout session. Anticipate pt is close to baseline mobility and she is a resident at [facility]. Recommend return to SNF where all further needs can be met. Will sign off. If needs change, please re-consult.     Follow Up Recommendations SNF;Supervision/Assistance - 24 hour    Equipment Recommendations  None recommended by PT    Recommendations for Other Services       Precautions / Restrictions Precautions Precautions: Fall Restrictions Weight Bearing Restrictions: No      Mobility  Bed Mobility Overal bed mobility: Needs Assistance Bed Mobility: Rolling Rolling: Total assist;+2 for physical assistance         General bed mobility comments: Assisted RN with clean up upon admission to room. Required total A to roll. Pt resistive throughout and PT had to hold pt's hands to avoid pt swatting at staff. Total A +2 for repositioning in bed.   Transfers                    Ambulation/Gait                Stairs            Wheelchair Mobility    Modified Rankin (Stroke Patients Only)       Balance                                             Pertinent Vitals/Pain Pain Assessment: Faces Faces Pain Scale: Hurts even more Pain Location: generalized Pain Descriptors / Indicators: Grimacing;Guarding Pain Intervention(s): Limited activity within patient's tolerance;Monitored during session;Repositioned    Home Living Family/patient  expects to be discharged to:: Skilled nursing facility                      Prior Function           Comments: Unsure of PLOF as pt unable to report. Anticipate close to baseline given cognitive deficits.      Hand Dominance        Extremity/Trunk Assessment   Upper Extremity Assessment Upper Extremity Assessment: Generalized weakness    Lower Extremity Assessment Lower Extremity Assessment: Generalized weakness       Communication   Communication: Expressive difficulties  Cognition Arousal/Alertness: Awake/alert Behavior During Therapy: Agitated;Restless Overall Cognitive Status: History of cognitive impairments - at baseline                                 General Comments: Pt with dementia at baseline. Very resistive to mobility and restless. Kept repeating profanities throughout.       General Comments General comments (skin integrity, edema, etc.): No family present during session.     Exercises     Assessment/Plan    PT Assessment Patent does not need any further PT services;All further PT needs  can be met in the next venue of care  PT Problem List Decreased activity tolerance;Decreased strength;Decreased balance;Decreased mobility;Decreased cognition;Decreased coordination;Decreased knowledge of use of DME;Decreased safety awareness;Decreased knowledge of precautions       PT Treatment Interventions      PT Goals (Current goals can be found in the Care Plan section)  Acute Rehab PT Goals PT Goal Formulation: All assessment and education complete, DC therapy Time For Goal Achievement: 06/05/20 Potential to Achieve Goals: Fair    Frequency     Barriers to discharge        Co-evaluation               AM-PAC PT "6 Clicks" Mobility  Outcome Measure Help needed turning from your back to your side while in a flat bed without using bedrails?: Total Help needed moving from lying on your back to sitting on the side of a  flat bed without using bedrails?: Total Help needed moving to and from a bed to a chair (including a wheelchair)?: Total Help needed standing up from a chair using your arms (e.g., wheelchair or bedside chair)?: Total Help needed to walk in hospital room?: Total Help needed climbing 3-5 steps with a railing? : Total 6 Click Score: 6    End of Session   Activity Tolerance: Treatment limited secondary to agitation Patient left: in bed;with call bell/phone within reach;with nursing/sitter in room Nurse Communication: Mobility status PT Visit Diagnosis: Other abnormalities of gait and mobility (R26.89);Difficulty in walking, not elsewhere classified (R26.2)    Time: 1450-1505 PT Time Calculation (min) (ACUTE ONLY): 15 min   Charges:   PT Evaluation $PT Eval Moderate Complexity: 1 Mod          Reuel Derby, PT, DPT  Acute Rehabilitation Services  Pager: [phone] Office: [phone]   Rudean Hitt 06/05/2020, 5:09 PM

## 2020-06-05 NOTE — Progress Notes (Signed)
Notified Dr. Robb Matar of eliquis not given due to NPO for failed swallowing in ED. Heparin SQ ordered per MD, IVF.

## 2020-06-06 LAB — BASIC METABOLIC PANEL
Anion gap: 12 (ref 5–15)
BUN: 29 mg/dL — ABNORMAL HIGH (ref 8–23)
CO2: 21 mmol/L — ABNORMAL LOW (ref 22–32)
Calcium: 9.4 mg/dL (ref 8.9–10.3)
Chloride: 110 mmol/L (ref 98–111)
Creatinine, Ser: 1.17 mg/dL — ABNORMAL HIGH (ref 0.44–1.00)
GFR calc Af Amer: 48 mL/min — ABNORMAL LOW (ref >60)
GFR calc non Af Amer: 41 mL/min — ABNORMAL LOW (ref >60)
Glucose, Bld: 59 mg/dL — ABNORMAL LOW (ref 70–99)
Potassium: 3 mmol/L — ABNORMAL LOW (ref 3.5–5.1)
Sodium: 143 mmol/L (ref 135–145)

## 2020-06-06 LAB — CBC
HCT: 29.9 % — ABNORMAL LOW (ref 36.0–46.0)
Hemoglobin: 9.5 g/dL — ABNORMAL LOW (ref 12.0–15.0)
MCH: 30.9 pg (ref 26.0–34.0)
MCHC: 31.8 g/dL (ref 30.0–36.0)
MCV: 97.4 fL (ref 80.0–100.0)
Platelets: 241 10*3/uL (ref 150–400)
RBC: 3.07 MIL/uL — ABNORMAL LOW (ref 3.87–5.11)
RDW: 14.4 % (ref 11.5–15.5)
WBC: 5 10*3/uL (ref 4.0–10.5)
nRBC: 0 % (ref 0.0–0.2)

## 2020-06-06 LAB — VALPROIC ACID LEVEL: Valproic Acid Lvl: <10 ug/mL — ABNORMAL LOW (ref 50.0–100.0)

## 2020-06-06 LAB — BRAIN NATRIURETIC PEPTIDE: B Natriuretic Peptide: 314.5 pg/mL — ABNORMAL HIGH (ref 0.0–100.0)

## 2020-06-06 LAB — C-REACTIVE PROTEIN: CRP: 3.6 mg/dL — ABNORMAL HIGH (ref <1.0)

## 2020-06-06 LAB — MAGNESIUM: Magnesium: 1.6 mg/dL — ABNORMAL LOW (ref 1.7–2.4)

## 2020-06-06 MED ORDER — POTASSIUM CHLORIDE 20 MEQ PO PACK
40.0000 meq | PACK | Freq: Two times a day (BID) | ORAL | Status: DC
  Administered 2020-06-06 – 2020-06-08 (×5): 40 meq via ORAL
  Filled 2020-06-06 (×5): qty 2

## 2020-06-06 MED ORDER — HALOPERIDOL LACTATE 5 MG/ML IJ SOLN
2.0000 mg | Freq: Four times a day (QID) | INTRAMUSCULAR | Status: DC | PRN
  Administered 2020-06-06: 2 mg via INTRAVENOUS
  Filled 2020-06-06: qty 1

## 2020-06-06 MED ORDER — SODIUM CHLORIDE 0.9 % IV SOLN
1.0000 g | INTRAVENOUS | Status: AC
  Administered 2020-06-06 – 2020-06-08 (×3): 1 g via INTRAVENOUS
  Filled 2020-06-06 (×3): qty 10

## 2020-06-06 MED ORDER — KCL-LACTATED RINGERS-D5W 20 MEQ/L IV SOLN
INTRAVENOUS | Status: DC
  Administered 2020-06-06: 1000 mL via INTRAVENOUS
  Filled 2020-06-06 (×3): qty 1000

## 2020-06-06 MED ORDER — MAGNESIUM SULFATE 2 GM/50ML IV SOLN
2.0000 g | Freq: Once | INTRAVENOUS | Status: AC
  Administered 2020-06-06: 2 g via INTRAVENOUS
  Filled 2020-06-06: qty 50

## 2020-06-06 MED ORDER — VALPROATE SODIUM 500 MG/5ML IV SOLN
500.0000 mg | Freq: Two times a day (BID) | INTRAVENOUS | Status: DC
  Filled 2020-06-06 (×2): qty 5

## 2020-06-06 MED ORDER — VALPROATE SODIUM 500 MG/5ML IV SOLN
500.0000 mg | Freq: Three times a day (TID) | INTRAVENOUS | Status: DC
  Administered 2020-06-06 – 2020-06-08 (×6): 500 mg via INTRAVENOUS
  Filled 2020-06-06 (×8): qty 5

## 2020-06-06 MED ORDER — APIXABAN 2.5 MG PO TABS
2.5000 mg | ORAL_TABLET | Freq: Two times a day (BID) | ORAL | Status: DC
  Administered 2020-06-06 – 2020-06-08 (×5): 2.5 mg via ORAL
  Filled 2020-06-06 (×5): qty 1

## 2020-06-06 NOTE — Progress Notes (Signed)
Pharmacy Consult - Depakote  86 year old female on Depakote 500 mg po BID prior to admission for Alzheimer's dementia / behavior control admitted with encephalopathy.  Depakote level low this AM.  Plan: Increase Depakote slightly to 500 mg iv Q 8 hours Neuro also following.  Thank you Okey Regal, PharmD

## 2020-06-06 NOTE — NC FL2 (Signed)
[place] MEDICAID FL2 LEVEL OF CARE SCREENING TOOL     IDENTIFICATION  Patient Name: [name] Birthdate: April 13, 1928 Sex: female Admission Date (Current Location): 06/04/2020  [facility] and [place] Number:  Producer, television/film/video and Address:  [facility], [address], [place], [place] [address]      Provider Number: [id number]  Attending Physician Name and Address:  Leroy Sea, MD  Relative Name and Phone Number:       Current Level of Care: Hospital Recommended Level of Care: Skilled Nursing Facility Prior Approval Number:    Date Approved/Denied:   PASRR Number: [id number]  Discharge Plan: SNF    Current Diagnoses: Patient Active Problem List   Diagnosis Date Noted  . Acute metabolic encephalopathy 06/04/2020  . Tremor   . Contusion of both eyes 12/27/2013  . Alzheimer's disease (HCC) 08/12/2013  . Coronary artery disease 07/31/2011  . Vitamin B 12 deficiency 05/15/2009  . HYPERLIPIDEMIA 05/10/2009  . LOC OSTEOARTHROS NOT SPEC PRIM/SEC UNSPEC SITE 05/10/2009  . MEMORY LOSS 05/10/2009  . Essential hypertension 05/10/2009    Orientation RESPIRATION BLADDER Height & Weight     Self  Normal Incontinent, External catheter Weight: 159 lb 2.8 oz (72.2 kg) Height:  5\' 4"  (162.6 cm)  BEHAVIORAL SYMPTOMS/MOOD NEUROLOGICAL BOWEL NUTRITION STATUS      Continent Diet (Please see DC Summary)  AMBULATORY STATUS COMMUNICATION OF NEEDS Skin   Extensive Assist Verbally Normal                       Personal Care Assistance Level of Assistance  Bathing, Feeding, Dressing Bathing Assistance: Maximum assistance Feeding assistance: Maximum assistance Dressing Assistance: Maximum assistance     Functional Limitations Info  Sight, Hearing, Speech Sight Info: Adequate Hearing Info: Adequate Speech Info: Adequate    SPECIAL CARE FACTORS FREQUENCY                       Contractures Contractures Info: Not present     Additional Factors Info  Code Status, Allergies Code Status Info: DNR Allergies Info: NKA           Current Medications (06/06/2020):  This is the current hospital active medication list Current Facility-Administered Medications  Medication Dose Route Frequency Provider Last Rate Last Admin  . acetaminophen (TYLENOL) tablet 650 mg  650 mg Oral Q6H PRN 06/08/2020 T, MD       Or  . acetaminophen (TYLENOL) suppository 650 mg  650 mg Rectal Q6H PRN Mikey College T, MD      . apixaban Mikey College) tablet 2.5 mg  2.5 mg Oral BID Everlene Balls, MD   2.5 mg at 06/06/20 0934  . ascorbic acid (VITAMIN C) tablet 250 mg  250 mg Oral BID 06/08/20 T, MD   250 mg at 06/06/20 0933  . calcium carbonate (OS-CAL - dosed in mg of elemental calcium) tablet 500 mg of elemental calcium  500 mg of elemental calcium Oral Q breakfast 06/08/20 T, MD   500 mg of elemental calcium at 06/06/20 0932  . calcium-vitamin D (OSCAL WITH D) 500-200 MG-UNIT per tablet 1 tablet  1 tablet Oral Daily 06/08/20, MD   1 tablet at 06/06/20 0933  . cefTRIAXone (ROCEPHIN) 1 g in sodium chloride 0.9 % 100 mL IVPB  1 g Intravenous Q24H 06/08/20 K, MD 200 mL/hr at 06/06/20 1013 1 g at 06/06/20 1013  .  citalopram (CELEXA) tablet 10 mg  10 mg Oral Daily Wynetta Fines T, MD   10 mg at 06/06/20 0932  . dextrose 5% in lactated ringers with KCl 20 mEq/L infusion   Intravenous Continuous Thurnell Lose, MD 75 mL/hr at 06/06/20 0750 1,000 mL at 06/06/20 0750  . docusate sodium (COLACE) capsule 100 mg  100 mg Oral Daily Wynetta Fines T, MD   100 mg at 06/06/20 0934  . ferrous sulfate tablet 325 mg  325 mg Oral BID WC Wynetta Fines T, MD   325 mg at 06/06/20 0934  . haloperidol lactate (HALDOL) injection 2 mg  2 mg Intravenous Q6H PRN Lang Snow, FNP   2 mg at 06/06/20 0025  . hydrALAZINE (APRESOLINE) tablet 25 mg  25 mg Oral Q6H PRN Wynetta Fines T, MD      . loratadine (CLARITIN) tablet 10 mg  10 mg Oral Daily Wynetta Fines T,  MD   10 mg at 06/06/20 0934  . magnesium oxide (MAG-OX) tablet 400 mg  400 mg Oral Daily Wynetta Fines T, MD   400 mg at 06/06/20 0933  . melatonin tablet 3 mg  3 mg Oral QHS Wynetta Fines T, MD      . memantine [facility]) tablet 10 mg  10 mg Oral BID Wynetta Fines T, MD   10 mg at 06/06/20 0933  . multivitamin with minerals tablet 1 tablet  1 tablet Oral Daily Lequita Halt, MD   1 tablet at 06/06/20 0932  . olopatadine (PATANOL) 0.1 % ophthalmic solution 1 drop  1 drop Both Eyes Daily Thurnell Lose, MD   1 drop at 06/06/20 1053  . polyethylene glycol (MIRALAX / GLYCOLAX) packet 17 g  17 g Oral Daily Wynetta Fines T, MD   17 g at 06/06/20 0932  . potassium chloride (KLOR-CON) packet 40 mEq  40 mEq Oral BID Thurnell Lose, MD   40 mEq at 06/06/20 0932  . senna-docusate (Senokot-S) tablet 1 tablet  1 tablet Oral Daily Lequita Halt, MD   1 tablet at 06/06/20 0932  . valproate (DEPACON) 500 mg in dextrose 5 % 50 mL IVPB  500 mg Intravenous Q8H Thurnell Lose, MD         Discharge Medications: Please see discharge summary for a list of discharge medications.  Relevant Imaging Results:  Relevant Lab Results:   Additional Information SSN: [id number]               COVID negative 06/04/20  Benard Halsted, LCSW

## 2020-06-06 NOTE — Evaluation (Addendum)
Clinical/Bedside Swallow Evaluation Patient Details  Name: [name] MRN: [id number] Date of Birth: Sep 21, 1928  Today's Date: 06/06/2020 Time: SLP Start Time (ACUTE ONLY): 0845 SLP Stop Time (ACUTE ONLY): 0904 SLP Time Calculation (min) (ACUTE ONLY): 19 min  Past Medical History:  Past Medical History:  Diagnosis Date  . Alzheimer's disease (HCC)   . Anemia   . Arthritis   . Cognitive communication deficit   . Complications affecting other specified body systems, hypertension   . Coronary atherosclerosis of unspecified type of vessel, native or graft    nonobstructive; unspecified site  . Dysphagia   . Localized osteoarthrosis not specified whether primary or secondary, unspecified site   . Memory loss   . Metabolic encephalopathy   . Other and unspecified hyperlipidemia   . Other B-complex deficiencies   . Unspecified essential hypertension    Past Surgical History:  Past Surgical History:  Procedure Laterality Date  . CARDIAC CATHETERIZATION  1992   real a 20% lesion in the LAD   HPI:  Pt is a 86 y/o female admitted from SNF secondary to AMS. Workup pending. PMH includes dementia, HTN, dysphagia. Notes from [facility] state pt on puree diet and plans for MBS but could not locate documentation for MBS.    Assessment / Plan / Recommendation Clinical Impression  Although there was no obvious signs of aspiration her risk is increased due to cognitive status with decreased awareness and overall deconditioning. Significant xerostomia and oral hygiene removed dried lingual and palatal secretions. From subjective view suspect strength of swallow is decreased (observation/palpation), multiple swallows. She needed intermittent cues to flex neck versus extend. Situation appears more Palliative as intermittent aspiration likely and controlling pt's environment re: positioning, sip/bite size and rate will be vital. Recommend initiate Puree texture, thin liquids, straws allowed, full assist  and crush meds. ST to follow up briefly.     SLP Visit Diagnosis: Dysphagia, unspecified (R13.10)    Aspiration Risk  Moderate aspiration risk;Mild aspiration risk    Diet Recommendation Dysphagia 1 (Puree);Thin liquid   Liquid Administration via: Cup;Straw Medication Administration: Crushed with puree Supervision: Staff to assist with self feeding;Full supervision/cueing for compensatory strategies Compensations: Slow rate;Small sips/bites Postural Changes: Seated upright at 90 degrees    Other  Recommendations Oral Care Recommendations: Oral care BID   Follow up Recommendations Skilled Nursing facility      Frequency and Duration min 1 x/week  2 weeks       Prognosis Prognosis for Safe Diet Advancement: Fair Barriers to Reach Goals: Cognitive deficits      Swallow Study   General HPI: Pt is a 86 y/o female admitted from SNF secondary to AMS. Workup pending. PMH includes dementia, HTN, dysphagia. Notes from [facility] state pt on puree diet and plans for MBS but could not locate documentation for MBS.  Type of Study: Bedside Swallow Evaluation Previous Swallow Assessment:  (none) Diet Prior to this Study: NPO Temperature Spikes Noted: No Respiratory Status: Nasal cannula History of Recent Intubation: No Behavior/Cognition: Alert;Pleasant mood;Cooperative;Confused;Requires cueing Oral Cavity Assessment: Dry;Dried secretions Oral Care Completed by SLP: Yes Oral Cavity - Dentition: Adequate natural dentition Vision: Functional for self-feeding Self-Feeding Abilities: Needs assist Patient Positioning: Upright in bed Baseline Vocal Quality: Normal Volitional Cough: Cognitively unable to elicit Volitional Swallow: Unable to elicit    Oral/Motor/Sensory Function Overall Oral Motor/Sensory Function: Within functional limits   Ice Chips Ice chips: Impaired Presentation: Spoon Pharyngeal Phase Impairments: Decreased hyoid-laryngeal movement   Thin Liquid Thin Liquid:  Impaired Presentation: Straw;Spoon Pharyngeal  Phase Impairments: Decreased hyoid-laryngeal movement    Nectar Thick Nectar Thick Liquid: Not tested   Honey Thick Honey Thick Liquid: Not tested   Puree Puree: Within functional limits   Solid     Solid: Not tested      Houston Siren 06/06/2020,9:19 AM  Orbie Pyo Colvin Caroli.Ed Risk analyst [phone] Office [phone]

## 2020-06-06 NOTE — Progress Notes (Signed)
CCMD informed staff about patient's cardiac rhythm being low ( high 40s - low50s). Patient asymptomatic; MD made aware. Will continue to monitor.

## 2020-06-06 NOTE — Progress Notes (Signed)
NEUROLOGY PROGRESS NOTE    Subjective: No complaints   Exam: Vitals:   06/06/20 0000 06/06/20 0400  BP: (!) 108/93 (!) 126/50  Pulse: 63 (!) 54  Resp: 20 14  Temp: 99.2 F (37.3 C) 97.7 F (36.5 C)  SpO2: 94% 93%     Neuro:  Mental Status: Alert, oriented oriented to hospital, able to show me her thumb, able to name the color of my glove, able to follow simple commands such as raising her arms.  Was alert to self.  Speech dysarthric however she did have significant dry mucous membranes without evidence of aphasia. Cranial Nerves: II:  Visual fields grossly normal,  III,IV, VI: ptosis not present, extra-ocular motions intact bilaterally pupils equal, round, reactive to light and accommodation V,VII: smile symmetric, facial light touch sensation normal bilaterally VIII: hearing normal bilaterally IX,X: Palate rises midline XII: midline tongue extension Motor: Right : Upper extremity   5/5    Left:     Upper extremity   5/5  Lower extremity   1/5     Lower extremity   1/5 -Dorsal foot, plantar flexion was 5/5 -Myoclonic activity and postural tremor Sensory: Pinprick and light touch intact throughout, bilaterally Deep Tendon Reflexes: 2+ and symmetric throughout Plantars: Right: downgoing   Left: downgoing Cerebellar: normal finger-to-nose,     Medications:  Scheduled: . apixaban  2.5 mg Oral BID  . vitamin C  250 mg Oral BID  . calcium carbonate  500 mg of elemental calcium Oral Q breakfast  . calcium-vitamin D  1 tablet Oral Daily  . citalopram  10 mg Oral Daily  . docusate sodium  100 mg Oral Daily  . ferrous sulfate  325 mg Oral BID WC  . loratadine  10 mg Oral Daily  . magnesium oxide  400 mg Oral Daily  . melatonin  3 mg Oral QHS  . memantine  10 mg Oral BID  . multivitamin with minerals  1 tablet Oral Daily  . olopatadine  1 drop Both Eyes Daily  . polyethylene glycol  17 g Oral Daily  . potassium chloride  40 mEq Oral BID  . senna-docusate  1 tablet  Oral Daily   Continuous: . cefTRIAXone (ROCEPHIN)  IV 1 g (06/06/20 1013)  . dextrose 5% lactated ringers with KCl 20 mEq/L 1,000 mL (06/06/20 0750)  . valproate sodium     [id number] **OR** acetaminophen, haloperidol lactate, hydrALAZINE  Pertinent Labs/Diagnostics:   EEG  Result Date: 06/04/2020  IMPRESSION: This study is suggestive of moderate diffuse encephalopathy, nonspecific to etiology.  Multiple episodes of nonrhythmic whole-body twitching were noted without concomitant EEG change and were not epileptic.  No seizures or epileptiform discharges were seen throughout the recording. Priyanka Lahoma Rocker PA-C [facility] [phone]  Assessment:  86 year old female with advanced dementia lives in skilled nursing facility who presented to the emergency department with waxing and waning mental status.  She was noted to have bilateral upper extremity tremors and be lethargic at time of arrival.  Patient is oral mucous membranes are extremely dry however she is able to state where she is along with color of my glove and follow simple commands at this time.  EEG is negative for epileptic activity.   Impression: -Metabolic/toxic encephalopathy in setting of UTI  Recommendations: -Continue Depakote with pharmacy to obtain therapeutic level -Continue to treat underlying infection and metabolic issues  At this time neurology will sign off please call with any further  questions  06/06/2020, 10:35 AM

## 2020-06-06 NOTE — Progress Notes (Signed)
Telemetry tech called and stated that pts HR was sustaining in the low 40s, upon enterting the room pt was resting with eyes closed, breathing was equal and nonlabored. I woke the pt up and HR then sustained in the high 50s, no other needs at this time, will CTM.

## 2020-06-06 NOTE — Progress Notes (Addendum)
PROGRESS NOTE                                                                                                                                                                                                             Patient Demographics:    [name], is a 86 y.o. female, DOB - April 04, 1928, XBD:[id number]  Admit date - 06/04/2020   Admitting Physician Lequita Halt, MD  Outpatient Primary MD for the patient is [place], Rosalyn Charters, MD  LOS - 2  Chief Complaint  Patient presents with   Altered Mental Status       Brief Narrative  [name] is a 86 y.o. female with medical history significant of Alzheimer disease, anxiety depression, DVT (diagnosed in November 2020, secondary to immobilization, on Eliquis [facility])), HTN, was sent from nursing home for evaluation of sudden change of mentation at [facility].   Subjective:    [name] today has, No headache, No chest pain, No abdominal pain - No Nausea, No new weakness tingling or numbness, No Cough - SOB, mildly confused.   Assessment  & Plan :     1.Metabolic Encephalopathy - in a patient with underlying Alzheimer's dementia due to combination of dehydration and UTI.  IV fluids and Rocephin, follow electrolytes, PT OT and speech eval, CT head and EEG unremarkable, chest x-ray nonacute, continue supportive care.  Not a candidate for heroic measures due to advanced age and frail status.  2.  Alzheimer's dementia.  Remains at risk for delirium in the hospital.  Minimize narcotics and benzodiazepines.  3.  Generalized weakness and deconditioning.  PT, OT and speech eval.  She is currently n.p.o., meds with a sip of water, speech eval ASAP to commence diet, IV fluids till then.  4.  History of DVT.  Continue Eliquis.  5.  Tremors upon admission.  Resolved.  She is on Depakote which has been switched to IV, EEG nonacute.   6.  Underlying sinus bradycardia.  Blood pressure stable, not a candidate for pacemaker placement due  to advanced age, dementia and frail status, discontinue Exelon patch.  7.  Hypomagnesemia and hypokalemia.  Replaced.    Condition -  Guarded  Family Communication  :   Called daughter [name] [phone] on 06/06/2020 message left   DW Son in law [name] - [phone] - 06/06/20    Code Status :  DNR  Consults  :  None  Procedures  :    CT Head - Non  acute  EEG - Non acute   PUD Prophylaxis : None  Disposition Plan  :    Status is: Inpatient  Remains inpatient appropriate because:IV treatments appropriate due to intensity of illness or inability to take PO   Dispo: The patient is from: SNF              Anticipated d/c is to: SNF              Anticipated d/c date is: > 3 days              Patient currently is not medically stable to d/c.  DVT Prophylaxis  : Eliquis  Lab Results  Component Value Date   PLT 241 06/06/2020    Diet :  Diet Order            Diet NPO time specified  Diet effective now                  Inpatient Medications Scheduled Meds:  vitamin C  250 mg Oral BID   aspirin  81 mg Oral Daily   calcium carbonate  500 mg of elemental calcium Oral Q breakfast   calcium-vitamin D  1 tablet Oral Daily   citalopram  10 mg Oral Daily   docusate sodium  100 mg Oral Daily   ferrous sulfate  325 mg Oral BID WC   heparin injection (subcutaneous)  5,000 Units Subcutaneous Q8H   loratadine  10 mg Oral Daily   magnesium oxide  400 mg Oral Daily   melatonin  3 mg Oral QHS   memantine  10 mg Oral BID   multivitamin with minerals  1 tablet Oral Daily   olopatadine  1 drop Both Eyes Daily   polyethylene glycol  17 g Oral Daily   rivastigmine  6 mg Oral BID   senna-docusate  1 tablet Oral Daily   Continuous Infusions:  dextrose 5% lactated ringers with KCl 20 mEq/L 1,000 mL (06/06/20 0750)   valproate sodium     PRN Meds:.acetaminophen **OR** acetaminophen, haloperidol lactate, hydrALAZINE  Antibiotics  :   Anti-infectives  (From admission, onward)   Start     Dose/Rate Route Frequency Ordered Stop   06/04/20 1515  cefTRIAXone (ROCEPHIN) 1 g in sodium chloride 0.9 % 100 mL IVPB        1 g 200 mL/hr over 30 Minutes Intravenous  Once 06/04/20 1501 06/04/20 1804          Objective:   Vitals:   06/05/20 1511 06/05/20 2109 06/06/20 0000 06/06/20 0400  BP: 120/66 (!) 112/101 (!) 108/93 (!) 126/50  Pulse: 72 (!) 54 63 (!) 54  Resp: 20 20 20 14   Temp: 98.9 F (37.2 C) 98.3 F (36.8 C) 99.2 F (37.3 C) 97.7 F (36.5 C)  TempSrc: Axillary Oral Axillary Axillary  SpO2: 95% 96% 94% 93%  Weight: 72.2 kg     Height:        SpO2: 93 % O2 Flow Rate (L/min): 2 L/min  Wt Readings from Last 3 Encounters:  06/05/20 72.2 kg  07/03/14 98.4 kg  02/23/14 101.2 kg     Intake/Output Summary (Last 24 hours) at 06/06/2020 0855 Last data filed at 06/06/2020 0750 Gross per 24 hour  Intake 285.92 ml  Output 100 ml  Net 185.92 ml     Physical Exam  Awake but pleasantly confused, no new F.N deficits  [place].AT,PERRAL Supple Neck,No JVD, No cervical lymphadenopathy appriciated.  Symmetrical Chest wall  movement, Good air movement bilaterally, CTAB RRR,No Gallops,Rubs or new Murmurs, No Parasternal Heave +ve B.Sounds, Abd Soft, No tenderness, No organomegaly appriciated, No rebound - guarding or rigidity. No Cyanosis, Clubbing or edema, No new Rash or bruise        Data Review:    Recent Labs  Lab 06/04/20 1048 06/04/20 1104 06/06/20 0742  WBC 6.5  --  5.0  HGB 10.8* 10.2* 9.5*  HCT 35.2* 30.0* 29.9*  PLT 285  --  241  MCV 97.2  --  97.4  MCH 29.8  --  30.9  MCHC 30.7  --  31.8  RDW 14.6  --  14.4  LYMPHSABS 1.7  --   --   MONOABS 0.6  --   --   EOSABS 0.1  --   --   BASOSABS 0.0  --   --     Recent Labs  Lab 06/04/20 1048 06/04/20 1055 06/04/20 1104 06/06/20 0742  NA 144  --  147* 143  K 3.5  --  3.3* 3.0*  CL 110  --   --  110  CO2 25  --   --  21*  GLUCOSE 136*  --   --  59*  BUN 33*   --   --  29*  CREATININE 1.23*  --   --  1.17*  CALCIUM 10.0  --   --  9.4  AST 22  --   --   --   ALT 17  --   --   --   ALKPHOS 94  --   --   --   BILITOT 0.2*  --   --   --   ALBUMIN 2.4*  --   --   --   MG 1.6*  --   --  1.6*  INR 1.2  --   --   --   TSH 4.195  --   --   --   AMMONIA  --  24  --   --   BNP 126.7*  --   --   --     Recent Labs  Lab 06/04/20 1048 06/04/20 2320  BNP 126.7*  --   SARSCOV2NAA  --  NEGATIVE    ------------------------------------------------------------------------------------------------------------------ No results for input(s): CHOL, HDL, LDLCALC, TRIG, CHOLHDL, LDLDIRECT in the last 72 hours.  No results found for: HGBA1C ------------------------------------------------------------------------------------------------------------------ Recent Labs    06/04/20 1048  TSH 4.195   ------------------------------------------------------------------------------------------------------------------ No results for input(s): VITAMINB12, FOLATE, FERRITIN, TIBC, IRON, RETICCTPCT in the last 72 hours.  Coagulation profile Recent Labs  Lab 06/04/20 1048  INR 1.2    No results for input(s): DDIMER in the last 72 hours.  Cardiac Enzymes No results for input(s): CKMB, TROPONINI, MYOGLOBIN in the last 168 hours.  Invalid input(s): CK ------------------------------------------------------------------------------------------------------------------    Component Value Date/Time   BNP 126.7 (H) 06/04/2020 1048    Micro Results Recent Results (from the past 240 hour(s))  SARS Coronavirus 2 by RT PCR (hospital order, performed in [facility] hospital lab) Nasopharyngeal Nasopharyngeal Swab     Status: None   Collection Time: 06/04/20 11:20 PM   Specimen: Nasopharyngeal Swab  Result Value Ref Range Status   SARS Coronavirus 2 NEGATIVE NEGATIVE Final    Comment: (NOTE) SARS-CoV-2 target nucleic acids are NOT DETECTED.  The SARS-CoV-2 RNA is  generally detectable in upper and lower respiratory specimens during the acute phase of infection. The lowest concentration of SARS-CoV-2 viral copies this assay can detect is  250 copies / mL. A negative result does not preclude SARS-CoV-2 infection and should not be used as the sole basis for treatment or other patient management decisions.  A negative result may occur with improper specimen collection / handling, submission of specimen other than nasopharyngeal swab, presence of viral mutation(s) within the areas targeted by this assay, and inadequate number of viral copies (<250 copies / mL). A negative result must be combined with clinical observations, patient history, and epidemiological information.  Fact Sheet for Patients:   BoilerBrush.com.cyhttps://www.fda.gov/media/136312/download  Fact Sheet for Healthcare Providers: https://pope.com/https://www.fda.gov/media/136313/download  This test is not yet approved or  cleared by the [place] FDA and has been authorized for detection and/or diagnosis of SARS-CoV-2 by FDA under an Emergency Use Authorization (EUA).  This EUA will remain in effect (meaning this test can be used) for the duration of the COVID-19 declaration under Section 564(b)(1) of the Act, 21 U.S.C. section 360bbb-3(b)(1), unless the authorization is terminated or revoked sooner.  Performed at [facility], [address] [place], [place] [address]   MRSA PCR Screening     Status: None   Collection Time: 06/05/20  3:06 PM   Specimen: Nasal Mucosa; Nasopharyngeal  Result Value Ref Range Status   MRSA by PCR NEGATIVE NEGATIVE Final    Comment:        The GeneXpert MRSA Assay (FDA approved for NASAL specimens only), is one component of a comprehensive MRSA colonization surveillance program. It is not intended to diagnose MRSA infection nor to guide or monitor treatment for MRSA infections. Performed at [facility], [address] [place], [place] [address]      Radiology Reports EEG  Result Date: 06/04/2020 Charlsie QuestYadav, Priyanka O, MD     06/04/2020  4:15 PM Patient Name: Asencion PartridgeWilda H Newton MRN: [id number] Epilepsy Attending: Charlsie QuestPriyanka O Yadav Referring Physician/Provider: Dr. Georgiana SpinnerSushanth Aroor Date: 06/04/2020 Duration: 22.41 minutes Patient history: 86 year old female with Alzheimer's dementia who presented with altered mental status and noted to have intermittent twitching of the whole body.  EEG evaluate for seizures. Level of alertness: Awake AEDs during EEG study: Valproic acid Technical aspects: This EEG study was done with scalp electrodes positioned according to the 10-20 International system of electrode placement. Electrical activity was acquired at a sampling rate of 500Hz  and reviewed with a high frequency filter of 70Hz  and a low frequency filter of 1Hz . EEG data were recorded continuously and digitally stored. Description: No posterior dominant rhythm was seen.  EEG showed continuous generalized 3 to 5 Hz theta-delta slowing.  Patient was noted to have intermittent whole-body nonrhythmic twitching movements.  Concomitant EEG before, during and after the event did not show any EEG changes suggest seizure.  Hyperventilation and photic stimulation were not performed.   ABNORMALITY -Continuous slow, generalized IMPRESSION: This study is suggestive of moderate diffuse encephalopathy, nonspecific to etiology.  Multiple episodes of nonrhythmic whole-body twitching were noted without concomitant EEG change and were not epileptic.  No seizures or epileptiform discharges were seen throughout the recording. Charlsie Questriyanka O Yadav   CT Head Wo Contrast  Result Date: 06/04/2020 CLINICAL DATA:  Altered mental status (AMS), unclear cause. Additional history provided: Not following commands as usual, more lethargic. EXAM: CT HEAD WITHOUT CONTRAST TECHNIQUE: Contiguous axial images were obtained from the base of the skull through the vertex without intravenous contrast. COMPARISON:   Head CT 12/02/2019, brain MRI 06/13/2014 FINDINGS: Brain: Motion degraded examination, particularly at the level of the vertices. Redemonstrated moderate parenchymal atrophy  which somewhat disproportionately affects the medial temporal lobes/hippocampi. Mild patchy hypodensity within the cerebral white matter is nonspecific, but consistent with chronic small vessel ischemic disease. There is no acute intracranial hemorrhage. No acute demarcated cortical infarct is identified. No extra-axial fluid collection. No evidence of intracranial mass. No midline shift. Partially empty sella turcica. Vascular: No hyperdense vessel.  Atherosclerotic calcifications. Skull: Normal. Negative for fracture or focal lesion. Sinuses/Orbits: Visualized orbits show no acute finding. Mild ethmoid sinus mucosal thickening. Bilateral mastoid effusions. IMPRESSION: 1. Motion degraded examination, limiting evaluation. 2. No CT evidence of acute intracranial abnormality. 3. Moderate parenchymal atrophy which somewhat disproportionately affects the medial temporal lobes/hippocampi. 4. Stable, mild chronic small vessel ischemic disease. 5. Mild ethmoid sinus mucosal thickening. 6. Bilateral mastoid effusions. Electronically Signed   By: Jackey Loge DO   On: 06/04/2020 14:41   DG Chest Port 1 View  Result Date: 06/04/2020 CLINICAL DATA:  Altered mental status EXAM: PORTABLE CHEST 1 VIEW COMPARISON:  11/08/2019 chest radiograph. FINDINGS: Right rotated chest radiograph. Low lung volumes. Stable cardiomediastinal silhouette with top-normal heart size. No pneumothorax. Chronic mild blunting of the left costophrenic angle is unchanged. No right pleural effusion. No pulmonary edema. Mild bibasilar scarring versus atelectasis. No acute consolidative airspace disease. IMPRESSION: 1. Low lung volumes with mild bibasilar scarring versus atelectasis. 2. Chronic mild blunting of the left costophrenic angle, favoring mild pleural-parenchymal scarring.  Electronically Signed   By: Delbert Phenix M.D.   On: 06/04/2020 11:49    Time Spent in minutes  30   Susa Raring M.D on 06/06/2020 at 8:55 AM  To page go to [email or web address] - password [facility]

## 2020-06-06 NOTE — Progress Notes (Signed)
PT Cancellation Note and Discharge  Patient Details Name: [name] MRN: [id number] DOB: November 14, 1928   Cancelled Treatment:    Reason Eval/Treat Not Completed: New PT consult received, chart reviewed. Pt was evaluated on 6/22 and discharged from acute PT services. No significant changes noted from when evaluation was completed. It appears pt is near baseline and recommendation is return to SNF where she is a resident. Please see PT evaluation from 6/22 for further details. Will sign off again.    Marylynn Pearson 06/06/2020, 7:31 AM   Conni Slipper, PT, DPT Acute Rehabilitation Services Pager: [phone] Office: [phone]

## 2020-06-06 NOTE — Progress Notes (Signed)
Patient was transferred to Med-Surg level of care per MD order. Will continue to monitor.  

## 2020-06-07 LAB — CBC WITH DIFFERENTIAL/PLATELET
Abs Immature Granulocytes: 0.02 10*3/uL (ref 0.00–0.07)
Basophils Absolute: 0 10*3/uL (ref 0.0–0.1)
Basophils Relative: 1 %
Eosinophils Absolute: 0.2 10*3/uL (ref 0.0–0.5)
Eosinophils Relative: 3 %
HCT: 30.5 % — ABNORMAL LOW (ref 36.0–46.0)
Hemoglobin: 9.6 g/dL — ABNORMAL LOW (ref 12.0–15.0)
Immature Granulocytes: 0 %
Lymphocytes Relative: 39 %
Lymphs Abs: 1.9 10*3/uL (ref 0.7–4.0)
MCH: 30.3 pg (ref 26.0–34.0)
MCHC: 31.5 g/dL (ref 30.0–36.0)
MCV: 96.2 fL (ref 80.0–100.0)
Monocytes Absolute: 0.6 10*3/uL (ref 0.1–1.0)
Monocytes Relative: 12 %
Neutro Abs: 2.2 10*3/uL (ref 1.7–7.7)
Neutrophils Relative %: 45 %
Platelets: 239 10*3/uL (ref 150–400)
RBC: 3.17 MIL/uL — ABNORMAL LOW (ref 3.87–5.11)
RDW: 14.5 % (ref 11.5–15.5)
WBC: 4.9 10*3/uL (ref 4.0–10.5)
nRBC: 0 % (ref 0.0–0.2)

## 2020-06-07 LAB — COMPREHENSIVE METABOLIC PANEL
ALT: 17 U/L (ref 0–44)
AST: 22 U/L (ref 15–41)
Albumin: 2.1 g/dL — ABNORMAL LOW (ref 3.5–5.0)
Alkaline Phosphatase: 81 U/L (ref 38–126)
Anion gap: 8 (ref 5–15)
BUN: 31 mg/dL — ABNORMAL HIGH (ref 8–23)
CO2: 25 mmol/L (ref 22–32)
Calcium: 9.5 mg/dL (ref 8.9–10.3)
Chloride: 113 mmol/L — ABNORMAL HIGH (ref 98–111)
Creatinine, Ser: 1.14 mg/dL — ABNORMAL HIGH (ref 0.44–1.00)
GFR calc Af Amer: 49 mL/min — ABNORMAL LOW (ref >60)
GFR calc non Af Amer: 42 mL/min — ABNORMAL LOW (ref >60)
Glucose, Bld: 102 mg/dL — ABNORMAL HIGH (ref 70–99)
Potassium: 4.9 mmol/L (ref 3.5–5.1)
Sodium: 146 mmol/L — ABNORMAL HIGH (ref 135–145)
Total Bilirubin: 0.4 mg/dL (ref 0.3–1.2)
Total Protein: 5.5 g/dL — ABNORMAL LOW (ref 6.5–8.1)

## 2020-06-07 LAB — C-REACTIVE PROTEIN: CRP: 4.4 mg/dL — ABNORMAL HIGH (ref <1.0)

## 2020-06-07 LAB — BRAIN NATRIURETIC PEPTIDE: B Natriuretic Peptide: 303.3 pg/mL — ABNORMAL HIGH (ref 0.0–100.0)

## 2020-06-07 MED ORDER — ENSURE ENLIVE PO LIQD
237.0000 mL | Freq: Three times a day (TID) | ORAL | Status: DC
  Administered 2020-06-07 – 2020-06-08 (×5): 237 mL via ORAL

## 2020-06-07 MED ORDER — DEXTROSE 5 % IV SOLN: INTRAVENOUS | Status: DC

## 2020-06-07 NOTE — Progress Notes (Signed)
Occupational Therapy Evaluation Patient Details Name: [name] MRN: [id number] DOB: 21-Jul-1928 Today's Date: 06/07/2020    History of Present Illness Pt is a 86 y/o female admitted from SNF secondary to AMS. Workup pending. PMH includes dementia and HTN.    Clinical Impression   Patient here from SNF where she is a long term resident.  She has dementia and requires high levels of care at baseline.  Today patient performing likely at baseline, requiring total assist with ADLs and mobility, mostly due to cognition.  Patient had poor UE strength and some shakiness with movement.  Able to respond with simple sayings and follow simple commands.  OT will sign off as patient is functioning at baseline level and returning to SNF.    Follow Up Recommendations  No OT follow up    Equipment Recommendations  Other (comment) (defer to SNF)    Recommendations for Other Services       Precautions / Restrictions Precautions Precautions: Fall Restrictions Weight Bearing Restrictions: No      Mobility Bed Mobility Overal bed mobility: Needs Assistance Bed Mobility: Rolling Rolling: Total assist;+2 for physical assistance            Transfers                      Balance                                           ADL either performed or assessed with clinical judgement   ADL Overall ADL's : Needs assistance/impaired                                     Functional mobility during ADLs: Total assistance;+2 for physical assistance General ADL Comments: Patient is total assist with all ADLs at this time. This is likely her baseline as mostly due to cognition.     Vision         Perception     Praxis      Pertinent Vitals/Pain Pain Assessment: Faces Faces Pain Scale: Hurts little more (With stretching/moving L UE) Pain Intervention(s): Repositioned;Limited activity within patient's tolerance     Hand Dominance      Extremity/Trunk Assessment Upper Extremity Assessment Upper Extremity Assessment: RUE deficits/detail;LUE deficits/detail RUE Deficits / Details: Shoulder flex to ~80 degrees, very shaky.  Strength 2/5 gross.   RUE Coordination: decreased fine motor;decreased gross motor LUE Deficits / Details: L UE more painful with movement.  Gross strength 2-/5.  Digits in flexed position and patient has difficulty with extension.  Able to passively extend MCPs/PIPs to 90 degrees LUE Coordination: decreased fine motor;decreased gross motor           Communication Communication Communication: Expressive difficulties   Cognition Arousal/Alertness: Lethargic Behavior During Therapy: Flat affect Overall Cognitive Status: History of cognitive impairments - at baseline                                 General Comments: Dementia at baseline.  Patient able to respond with simple sayings and follow simple commands.   General Comments       Exercises     Shoulder Instructions      Home Living Family/patient expects to  be discharged to:: Skilled nursing facility                                        Prior Functioning/Environment Level of Independence: Needs assistance        Comments: According to chart, patient has severe dementia.  Long term SNF resident requiring a lot of assist.  Is usually only able to respond with simple verbal responses.          OT Problem List: Decreased strength;Decreased range of motion;Decreased activity tolerance;Impaired balance (sitting and/or standing);Decreased coordination;Decreased cognition;Pain      OT Treatment/Interventions: Self-care/ADL training;Therapeutic exercise;Therapeutic activities    OT Goals(Current goals can be found in the care plan section) Acute Rehab OT Goals Patient Stated Goal: unable to state OT Goal Formulation: Patient unable to participate in goal setting Time For Goal Achievement:  06/21/20 Potential to Achieve Goals: Fair  OT Frequency: Min 2X/week   Barriers to D/C:            Co-evaluation              AM-PAC OT "6 Clicks" Daily Activity     Outcome Measure Help from another person eating meals?: Total Help from another person taking care of personal grooming?: Total Help from another person toileting, which includes using toliet, bedpan, or urinal?: Total Help from another person bathing (including washing, rinsing, drying)?: Total Help from another person to put on and taking off regular upper body clothing?: Total Help from another person to put on and taking off regular lower body clothing?: Total 6 Click Score: 6   End of Session    Activity Tolerance: Patient tolerated treatment well;Patient limited by lethargy Patient left: in bed;with call bell/phone within reach;with bed alarm set  OT Visit Diagnosis: Unsteadiness on feet (R26.81);Muscle weakness (generalized) (M62.81);Other symptoms and signs involving cognitive function;Feeding difficulties (R63.3)                Time: 5427-0623 OT Time Calculation (min): 12 min Charges:  OT General Charges $OT Visit: 1 Visit OT Evaluation $OT Eval Moderate Complexity: 1 [address], OTR/L   [name] 06/07/2020, 2:54 PM

## 2020-06-07 NOTE — Progress Notes (Signed)
Initial Nutrition Assessment  DOCUMENTATION CODES:   Not applicable  INTERVENTION:  Ensure Enlive po TID, each supplement provides 350 kcal and 20 grams of protein  Magic cup TID with meals, each supplement provides 290 kcal and 9 grams of protein  Continue MVI daily  NUTRITION DIAGNOSIS:   Predicted suboptimal nutrient intake related to chronic illness (dementia) as evidenced by per patient/family report.    GOAL:   Patient will meet greater than or equal to 90% of their needs    MONITOR:   PO intake, Supplement acceptance, Weight trends, Labs, I & O's  REASON FOR ASSESSMENT:   Malnutrition Screening Tool    ASSESSMENT:   Pt from SNF admitted with metabolic encephalopathy. PMH significant for Alzheimer's disease, anxiety, depression, DVT, HTN.   Pt unavailable for RD visit. Per H&P, pt has advanced dementia and is unable to take care of herself at baseline. The pt's daughter reported that, at baseline, the pt "can be interactive, but very moody and emotional."  Limited wt history available for review.   PO Intake: 20-100% x 2 recorded meals  UOP: x24 hours I/O: +2150.16ml since admit Mild pitting edema noted BLE.  Labs: Na 146 (H) Medications: Vitamin C, Os-cal, Oscal with D, Colace, Ferrous Sulfate, Mag-Ox, MVI, Miralax, Klor-con, Senokot-S  NUTRITION - FOCUSED PHYSICAL EXAM:  Unable to perform at this time. Will attempt at follow-up.  Diet Order:   Diet Order            DIET - DYS 1 Room service appropriate? No; Fluid consistency: Thin  Diet effective now                 EDUCATION NEEDS:   No education needs have been identified at this time  Skin:  Skin Assessment: Reviewed RN Assessment  Last BM:  6/21  Height:   Ht Readings from Last 1 Encounters:  06/04/20 5\' 4"  (1.626 m)    Weight:   Wt Readings from Last 1 Encounters:  06/05/20 72.2 kg    BMI:  Body mass index is 27.32 kg/m.  Estimated Nutritional Needs:   Kcal:   1600-1800  Protein:  80-95 grams  Fluid:  >1.6L/d    06/07/20, MS, RD, LDN RD pager number and weekend/on-call pager number located in Amion.

## 2020-06-07 NOTE — Progress Notes (Signed)
PROGRESS NOTE                                                                                                                                                                                                             Patient Demographics:    [name], is a 86 y.o. female, DOB - 1928/08/15, BWG:[id number]  Admit date - 06/04/2020   Admitting Physician Emeline General, MD  Outpatient Primary MD for the patient is [place], Myrene Galas, MD  LOS - 3  Chief Complaint  Patient presents with   Altered Mental Status       Brief Narrative  [name] is a 86 y.o. female with medical history significant of Alzheimer disease, anxiety depression, DVT (diagnosed in November 2020, secondary to immobilization, on Eliquis [facility])), HTN, was sent from nursing home for evaluation of sudden change of mentation at [facility].   Subjective:   Patient in bed, appears comfortable, denies any headache, no fever, no chest pain or pressure, no shortness of breath , no abdominal pain. No focal weakness.  Remains confused.   Assessment  & Plan :     1. Metabolic Encephalopathy - in a patient with underlying Alzheimer's dementia due to combination of dehydration and UTI.  IV fluids and Rocephin, follow electrolytes, PT OT and speech eval, CT head and EEG unremarkable, chest x-ray nonacute, continue supportive care.  Not a candidate for heroic measures due to advanced age and frail status.  Detailed discussion with patient's daughter Sharl Ma on 06/07/2020, goal of care gentle medical treatment if declines full comfort measures.  2.  Alzheimer's dementia.  Remains at risk for delirium in the hospital.  Minimize narcotics and benzodiazepines.  3.  Generalized weakness and deconditioning.  PT, OT and speech eval.  She is currently n.p.o., meds with a sip of water, speech eval ASAP to commence diet, IV fluids till then.  4.  History of DVT.  Continue Eliquis.  5.  Tremors upon admission.  Resolved.  She is  on Depakote which has been switched to IV, EEG nonacute.  Pharmacy monitoring Depakote levels and dosing.   6.  Underlying sinus bradycardia.  Blood pressure stable, not a candidate for pacemaker placement due to advanced age, dementia and frail status, discontinued Exelon patch.  7.  Hypomagnesemia and hypokalemia.  Replaced.  8.  Hypernatremia.  Switch fluids to D5W.   Condition -  Guarded  Family Communication  :   Called daughter [name] [phone] on 06/06/2020 message  left, called again and message left on 06/07/2020 at 7:17 AM.    DW Son in law Maisie Fushomas - [phone] - 06/06/20, message left on 06/07/2020 at 7:19 AM  DW daughter [name] - [phone]- [phone] on 06/07/20   Code Status :  DNR  Consults  :  None  Procedures  :    CT Head - Non acute  EEG - Non acute   PUD Prophylaxis : None  Disposition Plan  :    Status is: Inpatient  Remains inpatient appropriate because:IV treatments appropriate due to intensity of illness or inability to take PO   Dispo: The patient is from: SNF              Anticipated d/c is to: SNF              Anticipated d/c date is: > 3 days              Patient currently is not medically stable to d/c.  DVT Prophylaxis  : Eliquis  Lab Results  Component Value Date   PLT 239 06/07/2020    Diet :  Diet Order            DIET - DYS 1 Room service appropriate? No; Fluid consistency: Thin  Diet effective now                  Inpatient Medications Scheduled Meds:  apixaban  2.5 mg Oral BID   vitamin C  250 mg Oral BID   calcium carbonate  500 mg of elemental calcium Oral Q breakfast   calcium-vitamin D  1 tablet Oral Daily   citalopram  10 mg Oral Daily   docusate sodium  100 mg Oral Daily   feeding supplement (ENSURE ENLIVE)  237 mL Oral TID BM   ferrous sulfate  325 mg Oral BID WC   loratadine  10 mg Oral Daily   magnesium oxide  400 mg Oral Daily   melatonin  3 mg Oral QHS   memantine  10 mg Oral BID   multivitamin  with minerals  1 tablet Oral Daily   olopatadine  1 drop Both Eyes Daily   polyethylene glycol  17 g Oral Daily   potassium chloride  40 mEq Oral BID   senna-docusate  1 tablet Oral Daily   Continuous Infusions:  cefTRIAXone (ROCEPHIN)  IV 1 g (06/06/20 1013)   dextrose     valproate sodium 500 mg (06/07/20 0519)   PRN Meds:.acetaminophen **OR** acetaminophen, haloperidol lactate, hydrALAZINE  Antibiotics  :   Anti-infectives (From admission, onward)   Start     Dose/Rate Route Frequency Ordered Stop   06/06/20 1000  cefTRIAXone (ROCEPHIN) 1 g in sodium chloride 0.9 % 100 mL IVPB     Discontinue     1 g 200 mL/hr over 30 Minutes Intravenous Every 24 hours 06/06/20 0903 06/09/20 0959   06/04/20 1515  cefTRIAXone (ROCEPHIN) 1 g in sodium chloride 0.9 % 100 mL IVPB        1 g 200 mL/hr over 30 Minutes Intravenous  Once 06/04/20 1501 06/04/20 1804          Objective:   Vitals:   06/06/20 1300 06/06/20 1400 06/06/20 2055 06/07/20 0455  BP:  (!) 134/56 (!) 111/52 90/80  Pulse: 62 62 69 63  Resp: 19 19 16 20   Temp: 98.9 F (37.2 C)  98.8 F (37.1 C) 99 F (37.2 C)  TempSrc: Axillary  Axillary Axillary  SpO2:  97% 94% 94% 96%  Weight:      Height:        SpO2: 96 % O2 Flow Rate (L/min): 2 L/min  Wt Readings from Last 3 Encounters:  06/05/20 72.2 kg  07/03/14 98.4 kg  02/23/14 101.2 kg     Intake/Output Summary (Last 24 hours) at 06/07/2020 0720 Last data filed at 06/07/2020 0650 Gross per 24 hour  Intake 2364.82 ml  Output 500 ml  Net 1864.82 ml     Physical Exam  Awake but pleasantly confused, no new F.N deficits  [place].AT,PERRAL Supple Neck,No JVD, No cervical lymphadenopathy appriciated.  Symmetrical Chest wall movement, Good air movement bilaterally, CTAB RRR,No Gallops, Rubs or new Murmurs, No Parasternal Heave +ve B.Sounds, Abd Soft, No tenderness, No organomegaly appriciated, No rebound - guarding or rigidity. No Cyanosis, Clubbing or edema, No  new Rash or bruise        Data Review:    Recent Labs  Lab 06/04/20 1048 06/04/20 1104 06/06/20 0742 06/07/20 0356  WBC 6.5  --  5.0 4.9  HGB 10.8* 10.2* 9.5* 9.6*  HCT 35.2* 30.0* 29.9* 30.5*  PLT 285  --  241 239  MCV 97.2  --  97.4 96.2  MCH 29.8  --  30.9 30.3  MCHC 30.7  --  31.8 31.5  RDW 14.6  --  14.4 14.5  LYMPHSABS 1.7  --   --  1.9  MONOABS 0.6  --   --  0.6  EOSABS 0.1  --   --  0.2  BASOSABS 0.0  --   --  0.0    Recent Labs  Lab 06/04/20 1048 06/04/20 1055 06/04/20 1104 06/06/20 0742 06/07/20 0356  NA 144  --  147* 143 146*  K 3.5  --  3.3* 3.0* 4.9  CL 110  --   --  110 113*  CO2 25  --   --  21* 25  GLUCOSE 136*  --   --  59* 102*  BUN 33*  --   --  29* 31*  CREATININE 1.23*  --   --  1.17* 1.14*  CALCIUM 10.0  --   --  9.4 9.5  AST 22  --   --   --  22  ALT 17  --   --   --  17  ALKPHOS 94  --   --   --  81  BILITOT 0.2*  --   --   --  0.4  ALBUMIN 2.4*  --   --   --  2.1*  MG 1.6*  --   --  1.6*  --   CRP  --   --   --  3.6* 4.4*  INR 1.2  --   --   --   --   TSH 4.195  --   --   --   --   AMMONIA  --  24  --   --   --   BNP 126.7*  --   --  314.5* 303.3*    Recent Labs  Lab 06/04/20 1048 06/04/20 2320 06/06/20 0742 06/07/20 0356  CRP  --   --  3.6* 4.4*  BNP 126.7*  --  314.5* 303.3*  SARSCOV2NAA  --  NEGATIVE  --   --     ------------------------------------------------------------------------------------------------------------------ No results for input(s): CHOL, HDL, LDLCALC, TRIG, CHOLHDL, LDLDIRECT in the last 72 hours.  No results found for: HGBA1C ------------------------------------------------------------------------------------------------------------------ Recent Labs    06/04/20 1048  TSH 4.195   ------------------------------------------------------------------------------------------------------------------ No results for input(s): VITAMINB12, FOLATE, FERRITIN, TIBC, IRON, RETICCTPCT in the last 72  hours.  Coagulation profile Recent Labs  Lab 06/04/20 1048  INR 1.2    No results for input(s): DDIMER in the last 72 hours.  Cardiac Enzymes No results for input(s): CKMB, TROPONINI, MYOGLOBIN in the last 168 hours.  Invalid input(s): CK ------------------------------------------------------------------------------------------------------------------    Component Value Date/Time   BNP 303.3 (H) 06/07/2020 0356    Micro Results Recent Results (from the past 240 hour(s))  SARS Coronavirus 2 by RT PCR (hospital order, performed in [facility] hospital lab) Nasopharyngeal Nasopharyngeal Swab     Status: None   Collection Time: 06/04/20 11:20 PM   Specimen: Nasopharyngeal Swab  Result Value Ref Range Status   SARS Coronavirus 2 NEGATIVE NEGATIVE Final    Comment: (NOTE) SARS-CoV-2 target nucleic acids are NOT DETECTED.  The SARS-CoV-2 RNA is generally detectable in upper and lower respiratory specimens during the acute phase of infection. The lowest concentration of SARS-CoV-2 viral copies this assay can detect is 250 copies / mL. A negative result does not preclude SARS-CoV-2 infection and should not be used as the sole basis for treatment or other patient management decisions.  A negative result may occur with improper specimen collection / handling, submission of specimen other than nasopharyngeal swab, presence of viral mutation(s) within the areas targeted by this assay, and inadequate number of viral copies (<250 copies / mL). A negative result must be combined with clinical observations, patient history, and epidemiological information.  Fact Sheet for Patients:   BoilerBrush.com.cy  Fact Sheet for Healthcare Providers: https://pope.com/  This test is not yet approved or  cleared by the [place] FDA and has been authorized for detection and/or diagnosis of SARS-CoV-2 by FDA under an Emergency Use Authorization  (EUA).  This EUA will remain in effect (meaning this test can be used) for the duration of the COVID-19 declaration under Section 564(b)(1) of the Act, 21 U.S.C. section 360bbb-3(b)(1), unless the authorization is terminated or revoked sooner.  Performed at [facility], [address], [place], [place] [address]   MRSA PCR Screening     Status: None   Collection Time: 06/05/20  3:06 PM   Specimen: Nasal Mucosa; Nasopharyngeal  Result Value Ref Range Status   MRSA by PCR NEGATIVE NEGATIVE Final    Comment:        The GeneXpert MRSA Assay (FDA approved for NASAL specimens only), is one component of a comprehensive MRSA colonization surveillance program. It is not intended to diagnose MRSA infection nor to guide or monitor treatment for MRSA infections. Performed at [facility], [address] [place], [place] [address]     Radiology Reports EEG  Result Date: 06/04/2020 Charlsie Quest, MD     06/04/2020  4:15 PM Patient Name: GENICE KIMBERLIN MRN: [id number] Epilepsy Attending: Charlsie Quest Referring Physician/Provider: Dr. Georgiana Spinner Aroor Date: 06/04/2020 Duration: 22.41 minutes Patient history: 86 year old female with Alzheimer's dementia who presented with altered mental status and noted to have intermittent twitching of the whole body.  EEG evaluate for seizures. Level of alertness: Awake AEDs during EEG study: Valproic acid Technical aspects: This EEG study was done with scalp electrodes positioned according to the 10-20 International system of electrode placement. Electrical activity was acquired at a sampling rate of 500Hz  and reviewed with a high frequency filter of 70Hz  and a low frequency filter of 1Hz . EEG data were recorded  continuously and digitally stored. Description: No posterior dominant rhythm was seen.  EEG showed continuous generalized 3 to 5 Hz theta-delta slowing.  Patient was noted to have intermittent whole-body nonrhythmic twitching movements.   Concomitant EEG before, during and after the event did not show any EEG changes suggest seizure.  Hyperventilation and photic stimulation were not performed.   ABNORMALITY -Continuous slow, generalized IMPRESSION: This study is suggestive of moderate diffuse encephalopathy, nonspecific to etiology.  Multiple episodes of nonrhythmic whole-body twitching were noted without concomitant EEG change and were not epileptic.  No seizures or epileptiform discharges were seen throughout the recording. Charlsie Quest   CT Head Wo Contrast  Result Date: 06/04/2020 CLINICAL DATA:  Altered mental status (AMS), unclear cause. Additional history provided: Not following commands as usual, more lethargic. EXAM: CT HEAD WITHOUT CONTRAST TECHNIQUE: Contiguous axial images were obtained from the base of the skull through the vertex without intravenous contrast. COMPARISON:  Head CT 12/02/2019, brain MRI 06/13/2014 FINDINGS: Brain: Motion degraded examination, particularly at the level of the vertices. Redemonstrated moderate parenchymal atrophy which somewhat disproportionately affects the medial temporal lobes/hippocampi. Mild patchy hypodensity within the cerebral white matter is nonspecific, but consistent with chronic small vessel ischemic disease. There is no acute intracranial hemorrhage. No acute demarcated cortical infarct is identified. No extra-axial fluid collection. No evidence of intracranial mass. No midline shift. Partially empty sella turcica. Vascular: No hyperdense vessel.  Atherosclerotic calcifications. Skull: Normal. Negative for fracture or focal lesion. Sinuses/Orbits: Visualized orbits show no acute finding. Mild ethmoid sinus mucosal thickening. Bilateral mastoid effusions. IMPRESSION: 1. Motion degraded examination, limiting evaluation. 2. No CT evidence of acute intracranial abnormality. 3. Moderate parenchymal atrophy which somewhat disproportionately affects the medial temporal lobes/hippocampi. 4.  Stable, mild chronic small vessel ischemic disease. 5. Mild ethmoid sinus mucosal thickening. 6. Bilateral mastoid effusions. Electronically Signed   By: Jackey Loge DO   On: 06/04/2020 14:41   DG Chest Port 1 View  Result Date: 06/04/2020 CLINICAL DATA:  Altered mental status EXAM: PORTABLE CHEST 1 VIEW COMPARISON:  11/08/2019 chest radiograph. FINDINGS: Right rotated chest radiograph. Low lung volumes. Stable cardiomediastinal silhouette with top-normal heart size. No pneumothorax. Chronic mild blunting of the left costophrenic angle is unchanged. No right pleural effusion. No pulmonary edema. Mild bibasilar scarring versus atelectasis. No acute consolidative airspace disease. IMPRESSION: 1. Low lung volumes with mild bibasilar scarring versus atelectasis. 2. Chronic mild blunting of the left costophrenic angle, favoring mild pleural-parenchymal scarring. Electronically Signed   By: Delbert Phenix M.D.   On: 06/04/2020 11:49    Time Spent in minutes  30   Susa Raring M.D on 06/07/2020 at 7:20 AM  To page go to [email or web address] - password [facility]

## 2020-06-08 DIAGNOSIS — N3000 Acute cystitis without hematuria: Principal | ICD-10-CM

## 2020-06-08 LAB — URINE CULTURE: Culture: NO GROWTH

## 2020-06-08 LAB — CBC WITH DIFFERENTIAL/PLATELET
Abs Immature Granulocytes: 0.04 10*3/uL (ref 0.00–0.07)
Basophils Absolute: 0 10*3/uL (ref 0.0–0.1)
Basophils Relative: 1 %
Eosinophils Absolute: 0.2 10*3/uL (ref 0.0–0.5)
Eosinophils Relative: 5 %
HCT: 28.1 % — ABNORMAL LOW (ref 36.0–46.0)
Hemoglobin: 8.7 g/dL — ABNORMAL LOW (ref 12.0–15.0)
Immature Granulocytes: 1 %
Lymphocytes Relative: 41 %
Lymphs Abs: 1.9 10*3/uL (ref 0.7–4.0)
MCH: 30 pg (ref 26.0–34.0)
MCHC: 31 g/dL (ref 30.0–36.0)
MCV: 96.9 fL (ref 80.0–100.0)
Monocytes Absolute: 0.5 10*3/uL (ref 0.1–1.0)
Monocytes Relative: 12 %
Neutro Abs: 1.8 10*3/uL (ref 1.7–7.7)
Neutrophils Relative %: 40 %
Platelets: 217 10*3/uL (ref 150–400)
RBC: 2.9 MIL/uL — ABNORMAL LOW (ref 3.87–5.11)
RDW: 14.7 % (ref 11.5–15.5)
WBC: 4.6 10*3/uL (ref 4.0–10.5)
nRBC: 0 % (ref 0.0–0.2)

## 2020-06-08 LAB — COMPREHENSIVE METABOLIC PANEL
ALT: 15 U/L (ref 0–44)
AST: 18 U/L (ref 15–41)
Albumin: 1.8 g/dL — ABNORMAL LOW (ref 3.5–5.0)
Alkaline Phosphatase: 69 U/L (ref 38–126)
Anion gap: 13 (ref 5–15)
BUN: 29 mg/dL — ABNORMAL HIGH (ref 8–23)
CO2: 22 mmol/L (ref 22–32)
Calcium: 7.9 mg/dL — ABNORMAL LOW (ref 8.9–10.3)
Chloride: 96 mmol/L — ABNORMAL LOW (ref 98–111)
Creatinine, Ser: 1.05 mg/dL — ABNORMAL HIGH (ref 0.44–1.00)
GFR calc Af Amer: 54 mL/min — ABNORMAL LOW (ref >60)
GFR calc non Af Amer: 47 mL/min — ABNORMAL LOW (ref >60)
Glucose, Bld: 496 mg/dL — ABNORMAL HIGH (ref 70–99)
Potassium: 4 mmol/L (ref 3.5–5.1)
Sodium: 131 mmol/L — ABNORMAL LOW (ref 135–145)
Total Bilirubin: 0.2 mg/dL — ABNORMAL LOW (ref 0.3–1.2)
Total Protein: 4.7 g/dL — ABNORMAL LOW (ref 6.5–8.1)

## 2020-06-08 LAB — BRAIN NATRIURETIC PEPTIDE: B Natriuretic Peptide: 164.3 pg/mL — ABNORMAL HIGH (ref 0.0–100.0)

## 2020-06-08 LAB — C-REACTIVE PROTEIN: CRP: 1.7 mg/dL — ABNORMAL HIGH (ref <1.0)

## 2020-06-08 MED ORDER — PANTOPRAZOLE SODIUM 40 MG PO TBEC: 40.0000 mg | DELAYED_RELEASE_TABLET | Freq: Every day | ORAL | 0 refills | Status: AC

## 2020-06-08 MED ORDER — VALPROIC ACID 250 MG/5ML PO SOLN
500.0000 mg | Freq: Three times a day (TID) | ORAL | Status: DC
  Administered 2020-06-08: 500 mg via ORAL
  Filled 2020-06-08 (×3): qty 10

## 2020-06-08 NOTE — Discharge Summary (Signed)
MAILY DEBARGE [name]:[id number] DOB: 1927-12-24 DOA: 06/04/2020  PCP: Bonnita Nasuti, MD  Admit date: 06/04/2020  Discharge date: 06/08/2020  Admitted From: SNF   Disposition:  SNF   Recommendations for Outpatient Follow-up:   Follow up with PCP in 1-2 weeks  PCP Please obtain BMP/CBC, 2 view CXR in 1week,  (see Discharge instructions)   PCP Please follow up on the following pending results: Focus on keeping patient comfortable, avoid unneeded hospitalizations or ER visits.  Goal of care is comfort.  If declines involve hospice.   Home Health: None   Equipment/Devices: None  Consultations: Neurology Discharge Condition: Guarded    CODE STATUS: DNR Diet Recommendation: Soft diet with feeding assistance and aspiration precautions    Chief Complaint  Patient presents with   Altered Mental Status     Brief history of present illness from the day of admission and additional interim summary    Nancy H Jonesis a 86 y.o.femalewith medical history significant ofAlzheimer disease,anxiety depression, DVT (diagnosed in November 2020, secondary to immobilization, on [facility])), HTN,was sent from nursing home for evaluation of sudden change of mentation at [facility].                                                                  Hospital Course      1. Metabolic Encephalopathy - in a patient with underlying Alzheimer's dementia due to combination of dehydration and UTI.  Was treated with IV fluids and Rocephin, [place] now close to baseline, underwent PT OT and speech eval, CT head and EEG unremarkable, chest x-ray nonacute, continue supportive care.  Not a candidate for heroic measures due to advanced age and frail status.  Detailed discussion with patient's daughter [name] on 06/07/2020, goal of care gentle  medical treatment if declines full comfort measures.  2.  Alzheimer's dementia.  Remains at risk for delirium in the hospital.  Minimize narcotics and benzodiazepines.  3.  Generalized weakness and deconditioning.  PT, OT and speech eval. improved with hydration and supportive care.  At baseline extremely frail due to advanced age, poor long-term prognosis, focus on keeping her comfortable.  4.  History of DVT.  Continue Eliquis.  5.  Tremors upon admission.  Resolved.  These were not seizures.  EEG nonacute.  Case was discussed with Neurologist Dr. Lorraine Lax  6.  Underlying sinus bradycardia.  Blood pressure stable, not a candidate for pacemaker placement due to advanced age, dementia and frail status, discontinued Exelon patch.  7.  Hypomagnesemia and hypokalemia.  Replaced.  8.  Hypernatremia.    Due to dehydration resolved after D5W.  Discharge diagnosis     Active Problems:   Vitamin B 12 deficiency   Alzheimer's disease ([place])   Acute metabolic encephalopathy    Discharge instructions  Discharge Instructions    Discharge instructions   Complete by: As directed    Follow with Primary MD Galvin ProfferHague, Imran P, MD in 7 days   Activity: As tolerated with Full fall precautions use walker/cane & assistance as needed  Disposition Home    Diet: Soft diet with feeding assistance and aspiration precautions.   Special Instructions: If you have smoked or chewed Tobacco  in the last 2 yrs please stop smoking, stop any regular Alcohol  and or any Recreational drug use.  On your next visit with your primary care physician please Get Medicines reviewed and adjusted.  Please request your Prim.MD to go over all Hospital Tests and Procedure/Radiological results at the follow up, please get all Hospital records sent to your Prim MD by signing hospital release before you go home.  If you experience worsening of your admission symptoms, develop shortness of breath, life threatening  emergency, suicidal or homicidal thoughts you must seek medical attention immediately by calling 911 or calling your MD immediately  if symptoms less severe.      Discharge Medications   Allergies as of 06/08/2020   No Known Allergies     Medication List    STOP taking these medications   aspirin 81 MG chewable tablet   rivastigmine 6 MG capsule Commonly known as: EXELON     TAKE these medications   benzonatate 100 MG capsule Commonly known as: TESSALON Take 100 mg by mouth every 8 (eight) hours as needed for cough.   calcium carbonate 1500 (600 Ca) MG Tabs tablet Commonly known as: OSCAL Take 600 mg of elemental calcium by mouth daily with breakfast.   CertaVite Senior Tabs Take 1 tablet by mouth daily.   citalopram 10 MG tablet Commonly known as: CELEXA Take 10 mg by mouth daily.   docusate sodium 100 MG capsule Commonly known as: COLACE Take 100 mg by mouth daily.   Eliquis 2.5 MG Tabs tablet Generic drug: apixaban Take 2.5 mg by mouth 2 (two) times daily.   ferrous sulfate 325 (65 FE) MG tablet Take 325 mg by mouth 2 (two) times daily with a meal.   loratadine 10 MG tablet Commonly known as: CLARITIN Take 10 mg by mouth daily.   melatonin 3 MG Tabs tablet Take 3 mg by mouth at bedtime.   memantine 10 MG tablet Commonly known as: NAMENDA Take 1 tablet (10 mg total) by mouth 2 (two) times daily.   NUTRITIONAL SUPPLEMENT PO Take 120 mLs by mouth 2 (two) times daily. MedPass   olopatadine 0.1 % ophthalmic solution Commonly known as: PATANOL Place 1 drop into both eyes daily.   pantoprazole 40 MG tablet Commonly known as: Protonix Take 1 tablet (40 mg total) by mouth daily.   polyethylene glycol 17 g packet Commonly known as: MIRALAX / GLYCOLAX Take 17 g by mouth at bedtime.   sennosides-docusate sodium 8.6-50 MG tablet Commonly known as: SENOKOT-S Take 1 tablet by mouth daily.   valproic acid 250 MG/5ML solution Commonly known as:  DEPAKENE Take 500 mg by mouth 2 (two) times daily. For agitation   vitamin C 250 MG tablet Commonly known as: ASCORBIC ACID Take 250 mg by mouth 2 (two) times daily.        Follow-up Information    Hague, Myrene GalasImran P, MD. Schedule an appointment as soon as possible for a visit in 1 week(s).   Specialty: Internal Medicine Contact information: [address] [place] [id number] [phone]  Major procedures and Radiology Reports - PLEASE review detailed and final reports thoroughly  -        EEG  Result Date: 06/04/2020 Charlsie Quest, MD     06/04/2020  4:15 PM Patient Name: LORANA MAFFEO MRN: [id number] Epilepsy Attending: Charlsie Quest Referring Physician/Provider: Dr. Georgiana Spinner Aroor Date: 06/04/2020 Duration: 22.41 minutes Patient history: 86 year old female with Alzheimer's dementia who presented with altered mental status and noted to have intermittent twitching of the whole body.  EEG evaluate for seizures. Level of alertness: Awake AEDs during EEG study: Valproic acid Technical aspects: This EEG study was done with scalp electrodes positioned according to the 10-20 International system of electrode placement. Electrical activity was acquired at a sampling rate of 500Hz  and reviewed with a high frequency filter of 70Hz  and a low frequency filter of 1Hz . EEG data were recorded continuously and digitally stored. Description: No posterior dominant rhythm was seen.  EEG showed continuous generalized 3 to 5 Hz theta-delta slowing.  Patient was noted to have intermittent whole-body nonrhythmic twitching movements.  Concomitant EEG before, during and after the event did not show any EEG changes suggest seizure.  Hyperventilation and photic stimulation were not performed.   ABNORMALITY -Continuous slow, generalized IMPRESSION: This study is suggestive of moderate diffuse encephalopathy, nonspecific to etiology.  Multiple episodes of nonrhythmic whole-body twitching  were noted without concomitant EEG change and were not epileptic.  No seizures or epileptiform discharges were seen throughout the recording.   CT Head Wo Contrast  Result Date: 06/04/2020 CLINICAL DATA:  Altered mental status (AMS), unclear cause. Additional history provided: Not following commands as usual, more lethargic. EXAM: CT HEAD WITHOUT CONTRAST TECHNIQUE: Contiguous axial images were obtained from the base of the skull through the vertex without intravenous contrast. COMPARISON:  Head CT 12/02/2019, brain MRI 06/13/2014 FINDINGS: Brain: Motion degraded examination, particularly at the level of the vertices. Redemonstrated moderate parenchymal atrophy which somewhat disproportionately affects the medial temporal lobes/hippocampi. Mild patchy hypodensity within the cerebral white matter is nonspecific, but consistent with chronic small vessel ischemic disease. There is no acute intracranial hemorrhage. No acute demarcated cortical infarct is identified. No extra-axial fluid collection. No evidence of intracranial mass. No midline shift. Partially empty sella turcica. Vascular: No hyperdense vessel.  Atherosclerotic calcifications. Skull: Normal. Negative for fracture or focal lesion. Sinuses/Orbits: Visualized orbits show no acute finding. Mild ethmoid sinus mucosal thickening. Bilateral mastoid effusions. IMPRESSION: 1. Motion degraded examination, limiting evaluation. 2. No CT evidence of acute intracranial abnormality. 3. Moderate parenchymal atrophy which somewhat disproportionately affects the medial temporal lobes/hippocampi. 4. Stable, mild chronic small vessel ischemic disease. 5. Mild ethmoid sinus mucosal thickening. 6. Bilateral mastoid effusions. Electronically Signed   By: 06/06/2020 DO   On: 06/04/2020 14:41   DG Chest Port 1 View  Result Date: 06/04/2020 CLINICAL DATA:  Altered mental status EXAM: PORTABLE CHEST 1 VIEW COMPARISON:  11/08/2019 chest radiograph.  FINDINGS: Right rotated chest radiograph. Low lung volumes. Stable cardiomediastinal silhouette with top-normal heart size. No pneumothorax. Chronic mild blunting of the left costophrenic angle is unchanged. No right pleural effusion. No pulmonary edema. Mild bibasilar scarring versus atelectasis. No acute consolidative airspace disease. IMPRESSION: 1. Low lung volumes with mild bibasilar scarring versus atelectasis. 2. Chronic mild blunting of the left costophrenic angle, favoring mild pleural-parenchymal scarring. Electronically Signed   By: 06/06/2020 M.D.   On: 06/04/2020 11:49    Micro Results     Recent Results (from the  past 240 hour(s))  Urine culture     Status: None   Collection Time: 06/04/20 12:25 PM   Specimen: Urine, Random  Result Value Ref Range Status   Specimen Description URINE, RANDOM  Final   Special Requests NONE  Final   Culture   Final    NO GROWTH Performed at [facility], [address] [place], [place] [address]    Report Status 06/08/2020 FINAL  Final  SARS Coronavirus 2 by RT PCR (hospital order, performed in [facility] hospital lab) Nasopharyngeal Nasopharyngeal Swab     Status: None   Collection Time: 06/04/20 11:20 PM   Specimen: Nasopharyngeal Swab  Result Value Ref Range Status   SARS Coronavirus 2 NEGATIVE NEGATIVE Final    Comment: (NOTE) SARS-CoV-2 target nucleic acids are NOT DETECTED.  The SARS-CoV-2 RNA is generally detectable in upper and lower respiratory specimens during the acute phase of infection. The lowest concentration of SARS-CoV-2 viral copies this assay can detect is 250 copies / mL. A negative result does not preclude SARS-CoV-2 infection and should not be used as the sole basis for treatment or other patient management decisions.  A negative result may occur with improper specimen collection / handling, submission of specimen other than nasopharyngeal swab, presence of viral mutation(s) within the areas targeted by  this assay, and inadequate number of viral copies (<250 copies / mL). A negative result must be combined with clinical observations, patient history, and epidemiological information.  Fact Sheet for Patients:   BoilerBrush.com.cy  Fact Sheet for Healthcare Providers: https://pope.com/  This test is not yet approved or  cleared by the [place] FDA and has been authorized for detection and/or diagnosis of SARS-CoV-2 by FDA under an Emergency Use Authorization (EUA).  This EUA will remain in effect (meaning this test can be used) for the duration of the COVID-19 declaration under Section 564(b)(1) of the Act, 21 U.S.C. section 360bbb-3(b)(1), unless the authorization is terminated or revoked sooner.  Performed at [facility], [address] [place], [place] [address]   MRSA PCR Screening     Status: None   Collection Time: 06/05/20  3:06 PM   Specimen: Nasal Mucosa; Nasopharyngeal  Result Value Ref Range Status   MRSA by PCR NEGATIVE NEGATIVE Final    Comment:        The GeneXpert MRSA Assay (FDA approved for NASAL specimens only), is one component of a comprehensive MRSA colonization surveillance program. It is not intended to diagnose MRSA infection nor to guide or monitor treatment for MRSA infections. Performed at [facility], [address], [place], [place] [address]     Today   Subjective    [name] today has no headache,no chest abdominal pain,no new weakness tingling or numbness, feels much better     Objective   Blood pressure (!) 129/51, pulse 77, temperature 98.9 F (37.2 C), temperature source Oral, resp. rate 16, height 5\' 4"  (1.626 m), weight 77.4 kg, SpO2 96 %.   Intake/Output Summary (Last 24 hours) at 06/08/2020 1037 Last data filed at 06/08/2020 0630 Gross per 24 hour  Intake 2247.46 ml  Output 1700 ml  Net 547.46 ml    Exam  Awake but confused, No new F.N deficits   [place].AT,PERRAL Supple Neck,No JVD, No cervical lymphadenopathy appriciated.  Symmetrical Chest wall movement, Good air movement bilaterally, CTAB RRR,No Gallops,Rubs or new Murmurs, No Parasternal Heave +ve B.Sounds, Abd Soft, Non tender, No organomegaly appriciated, No rebound -  guarding or rigidity. No Cyanosis, Clubbing or edema, No new Rash or bruise   Data Review   CBC w Diff:  Lab Results  Component Value Date   WBC 4.6 06/08/2020   HGB 8.7 (L) 06/08/2020   HCT 28.1 (L) 06/08/2020   PLT 217 06/08/2020   LYMPHOPCT 41 06/08/2020   MONOPCT 12 06/08/2020   EOSPCT 5 06/08/2020   BASOPCT 1 06/08/2020    CMP:  Lab Results  Component Value Date   NA 131 (L) 06/08/2020   K 4.0 06/08/2020   CL 96 (L) 06/08/2020   CO2 22 06/08/2020   BUN 29 (H) 06/08/2020   CREATININE 1.05 (H) 06/08/2020   PROT 4.7 (L) 06/08/2020   ALBUMIN 1.8 (L) 06/08/2020   BILITOT 0.2 (L) 06/08/2020   ALKPHOS 69 06/08/2020   AST 18 06/08/2020   ALT 15 06/08/2020  .   Total Time in preparing paper work, data evaluation and todays exam - 35 minutes  Susa Raring M.D on 06/08/2020 at 10:37 AM  [facility]   Office  [phone]

## 2020-06-08 NOTE — Discharge Instructions (Signed)
Follow with Primary MD Galvin Proffer, MD in 7 days   Activity: As tolerated with Full fall precautions use walker/cane & assistance as needed  Disposition Home    Diet: Soft diet with feeding assistance and aspiration precautions.   Special Instructions: If you have smoked or chewed Tobacco  in the last 2 yrs please stop smoking, stop any regular Alcohol  and or any Recreational drug use.  On your next visit with your primary care physician please Get Medicines reviewed and adjusted.  Please request your Prim.MD to go over all Hospital Tests and Procedure/Radiological results at the follow up, please get all Hospital records sent to your Prim MD by signing hospital release before you go home.  If you experience worsening of your admission symptoms, develop shortness of breath, life threatening emergency, suicidal or homicidal thoughts you must seek medical attention immediately by calling 911 or calling your MD immediately  if symptoms less severe.

## 2020-06-08 NOTE — Progress Notes (Signed)
Report was given to the Nurse at [facility] prior to D/C pt still waiting for [facility] for transportation. Night shift nurse is aware.

## 2020-06-08 NOTE — TOC Transition Note (Signed)
Transition of Care [facility]) - CM/SW Discharge Note   Patient Details  Name: [name] MRN: [id number] Date of Birth: [birth year]  Transition of Care [facility]) CM/SW Contact:  Baldemar Lenis, LCSW Phone Number: 06/08/2020, 11:24 AM   Clinical Narrative:   CSW confirmed return to [facility], and discussed with family to arrange time for paperwork at [facility]. SNF will be ready for patient after 2.  Nurse to call report to [phone], Room 703.   Transport set for 2:30 PM.    Final next level of care: Skilled Nursing Facility Barriers to Discharge: Barriers Resolved   Patient Goals and CMS Choice Patient states their goals for this hospitalization and ongoing recovery are:: patient unable to participate in goal setting due to disorientation CMS Medicare.gov Compare Post Acute Care list provided to:: Patient Represenative (must comment) Choice offered to / list presented to : Spouse, Adult Children  Discharge Placement              Patient chooses bed at: [facility] Patient to be transferred to facility by: [facility] Name of family member notified: Clovis Pu Patient and family notified of of transfer: 06/08/20  Discharge Plan and Services                                     Social Determinants of Health (SDOH) Interventions     Readmission Risk Interventions No flowsheet data found.

## 2020-06-14 ENCOUNTER — Encounter: Payer: Self-pay | Admitting: Internal Medicine

## 2020-06-14 ENCOUNTER — Non-Acute Institutional Stay: Payer: Medicare Other | Admitting: Internal Medicine

## 2020-06-14 ENCOUNTER — Other Ambulatory Visit: Payer: Self-pay

## 2020-06-14 DIAGNOSIS — Z515 Encounter for palliative care: Secondary | ICD-10-CM

## 2020-06-14 DIAGNOSIS — Z7189 Other specified counseling: Secondary | ICD-10-CM

## 2020-06-15 NOTE — Progress Notes (Signed)
[id number], 2021 [facility] Palliative Care Consult Note Telephone: [phone]  Fax: [phone]   PATIENT NAME: [name] DOB: 1928/04/27 MRN: [id number] [facility] (previously at [facility])   PRIMARY CARE PROVIDER Upton, Myrene Galas, MD [address] [place],  [place] [address]  Butch Penny NP [facility])   REFERRING PROVIDER: Dr. Lurline Idol   RESPONSIBLE PARTY: (spouse) [name] [phone]. (daughter) [name] [phone], [phone]. (daughter) [name] [phone]. [name] [phone], [name] (daughter) [phone]   ASSESSMENT / RECOMMENDATIONS:  1. Advance Care Planning: A. Directives: DNR on facility chart. Daughter [name] mentioned she discusses components of the MOST form with facility provider, who is sending the form to [name] for her signature, then [name] will mail back to the facility to add to patient's record. I reviewed components of the MOST form with [name]. Details: DNR/DNI. Limited Scope of Medical Intervention. Yes to IVFs and Antibiotics. No to Tube Feeding. B. Goals of Care: Patient unable to participate d/t advanced dementia. TC  with patient's daughter [name]. [name] would desire patient transfer to hospital to treat acute illnesses. However, if prognosis seemed poor, would discontinue treatments and return to [facility] for comfort care.    2. Cognitive / Functional status:  Patient is oriented to self only. She maintains good eye contact. Pleasant. Answers simple questions in a few word responses.   Patient is 100% dependent on all ADLs including feeding, dressing, bathing. She is bed bound and transfers with [facility] lift. Staff report no behavior issues, though resistant to transfers.    Staff report consumes 75-100% of her pureed meals. No recent weights. The last recorded weight I have was 172.4lbs 02/02/2020. At a height of 5'4" her BMi was 27kg/m2.   No pressure  injuries. R LE swelling toe to lower thigh. Mildly pitting.    3. Family Supports: Married to [place]. Two very supportive daughters.    4. Follow up Palliative Care Visit in 2-3 months or if further signs of decline. I updated daughter [name] with today's visit.    I spent 60 minutes providing this consultation from 9:30am-10:30am. More than 50% of the time in this consultation was spent coordinating communication.    HISTORY OF PRESENT ILLNESS:  [name] is a 86 y.o. female with PMH significant for CAD, HTN, Alzheimer's dementia, HLD, and arthritis with history of falls, DVT (Eliquis). 08/2019: ER after fall, large contusion/hematoma forehead and Lperiorbital ecchymosis. No fxs.  11/07/20: hospitalized [facility] hospitalized after fall, with hypernatremia (Na 169) 2/2 dehydration, AKI, fatty liver disease with transaminitis,  -12/02/2019: ER after fall. Head and neck CT no acute changes. -6/21-6/25/2021 hospitalized metabolic encephalopathy 2/2 dehydration, UTI. Underlying sinus bradycardia. Not a PM candidate.    This is a f/u Palliative Care visit from 02/13/2020.    CODE STATUS: DNR   PPS: 30%   HOSPICE ELIGIBILITY/DIAGNOSIS: TBD  PAST MEDICAL HISTORY:  Past Medical History:  Diagnosis Date  . Alzheimer's disease (HCC)   . Anemia   . Arthritis   . Cognitive communication deficit   . Complications affecting other specified body systems, hypertension   . Coronary atherosclerosis of unspecified type of vessel, native or graft    nonobstructive; unspecified site  . Dysphagia   . Localized osteoarthrosis not specified whether primary or secondary, unspecified site   . Memory loss   . Metabolic encephalopathy   . Other and unspecified hyperlipidemia   . Other B-complex  deficiencies   . Unspecified essential hypertension     SOCIAL HX:  Social History   Tobacco Use  . Smoking status: Never Smoker  . Smokeless tobacco: Never Used  Substance Use  Topics  . Alcohol use: No    Comment: Patient denies     ALLERGIES: No Known Allergies   PERTINENT MEDICATIONS:  Outpatient Encounter Medications as of 06/14/2020  Medication Sig  . apixaban (ELIQUIS) 2.5 MG TABS tablet Take 2.5 mg by mouth 2 (two) times daily.  Marland Kitchen aspirin 81 MG chewable tablet Chew 81 mg by mouth daily.  . benzonatate (TESSALON) 100 MG capsule Take 100 mg by mouth every 8 (eight) hours as needed for cough.   . calcium carbonate (OSCAL) 1500 (600 Ca) MG TABS tablet Take 600 mg of elemental calcium by mouth daily with breakfast.  . citalopram (CELEXA) 10 MG tablet Take 10 mg by mouth daily.  Marland Kitchen docusate sodium (COLACE) 100 MG capsule Take 100 mg by mouth daily.  . ferrous sulfate 325 (65 FE) MG tablet Take 325 mg by mouth 2 (two) times daily with a meal.  . loratadine (CLARITIN) 10 MG tablet Take 10 mg by mouth daily.  . Melatonin 3 MG TABS Take 3 mg by mouth at bedtime.   . memantine (NAMENDA) 10 MG tablet Take 1 tablet (10 mg total) by mouth 2 (two) times daily.  . Multiple Vitamins-Minerals (CERTAVITE SENIOR) TABS Take 1 tablet by mouth daily.  . Nutritional Supplements (NUTRITIONAL SUPPLEMENT PO) Take 120 mLs by mouth 2 (two) times daily. MedPass  . olopatadine (PATANOL) 0.1 % ophthalmic solution Place 1 drop into both eyes daily.   . pantoprazole (PROTONIX) 40 MG tablet Take 1 tablet (40 mg total) by mouth daily.  . polyethylene glycol (MIRALAX / GLYCOLAX) 17 g packet Take 17 g by mouth at bedtime.   . rivastigmine (EXELON) 6 MG capsule Take 6 mg by mouth 2 (two) times daily.  . sennosides-docusate sodium (SENOKOT-S) 8.6-50 MG tablet Take 1 tablet by mouth daily.  Marland Kitchen valproic acid (DEPAKENE) 250 MG/5ML solution Take 500 mg by mouth 2 (two) times daily. For agitation  . vitamin C (ASCORBIC ACID) 250 MG tablet Take 250 mg by mouth 2 (two) times daily.   No facility-administered encounter medications on file as of 06/14/2020.    PHYSICAL EXAM:   General: NAD, frail  appearing, lying supine in bed. She is engaging and very sweet. Answers simple questions and follows simple commands. Cardiovascular: regular rate and rhythm Pulmonary: clear ant fields Abdomen: soft, nontender, + bowel sounds GU: no suprapubic tenderness Extremities:mild bilateral LE edema R > L;. Bilateral foot drop. Stiff limbs and tremors/cogwheeling with passive ROM Skin: no rashes. A few dressings on R UE. Bruising bilateral arms Neurological: Weakness but otherwise non-focal    Anselm Lis, NP

## 2020-09-14 DEATH — deceased

## 2021-09-19 IMAGING — CT CT HEAD W/O CM
2 series · 15 of 37 positions shown, 18 images · non-contrast
Comparison: Head CT 12/02/2019, brain MRI 06/13/2014

CLINICAL DATA: Altered mental status (AMS), unclear cause.
Additional history provided: Not following commands as usual, more
lethargic.

EXAM:
CT HEAD WITHOUT CONTRAST
TECHNIQUE: Contiguous axial images were obtained from the base of the skull
through the vertex without intravenous contrast.

[Series 3: head 5.0 h30s · axial · 0.38mm/px · z∈[-155,-5]mm · 12 of 36 slices shown, 15 images]
[im 3/36  brain]
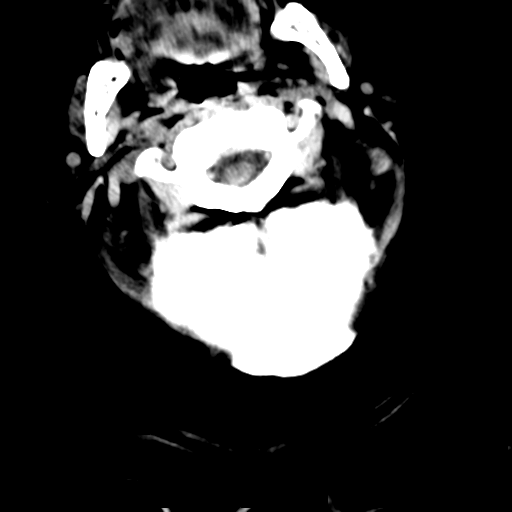
[im 3/36  bone]
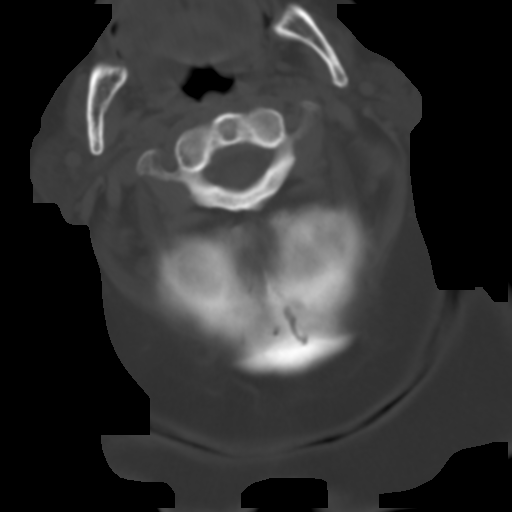
[im 5/36  brain]
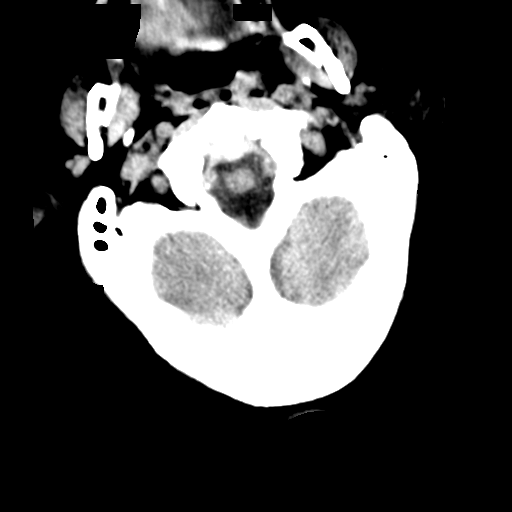
[im 8/36  brain]
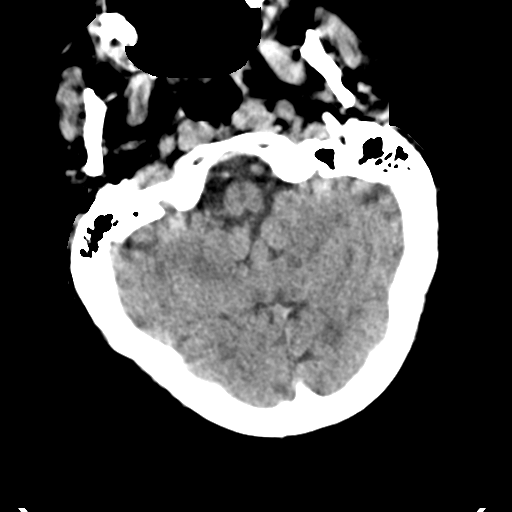
[im 11/36  brain]
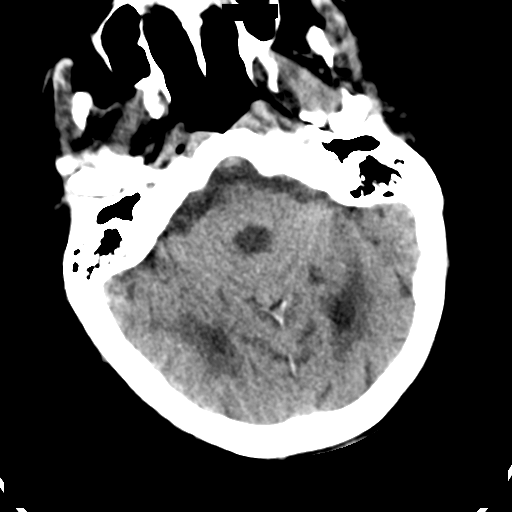
[im 14/36  brain]
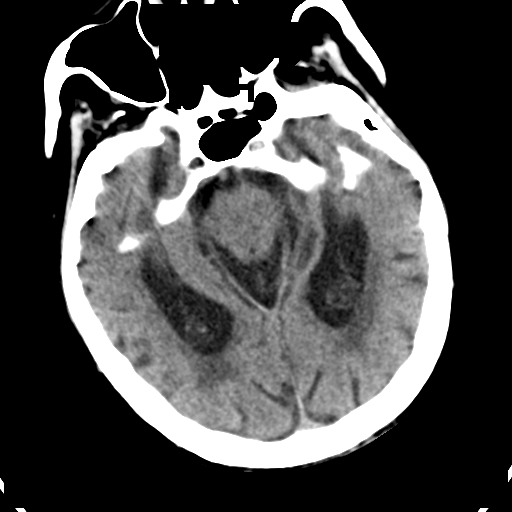
[im 14/36  bone]
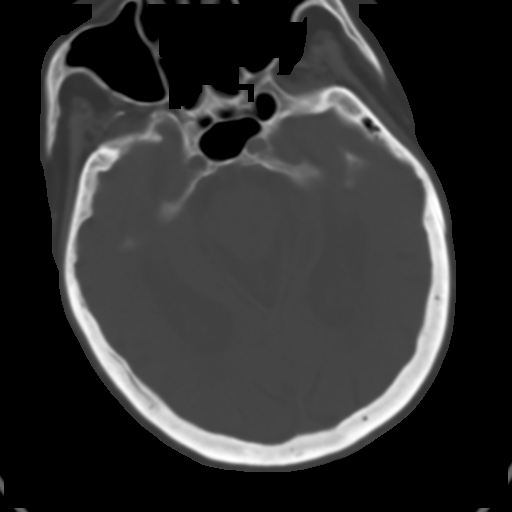
[im 16/36  brain]
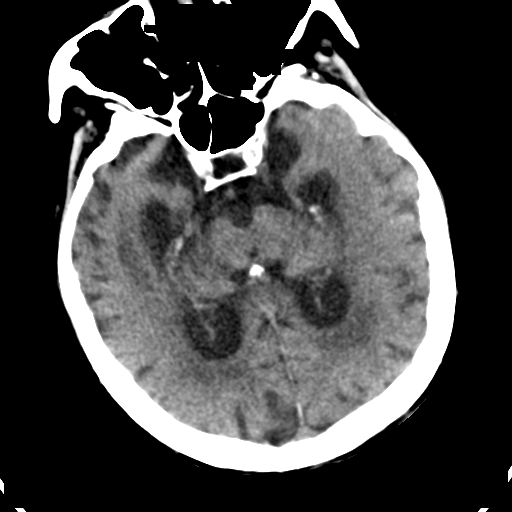
[im 20/36  brain]
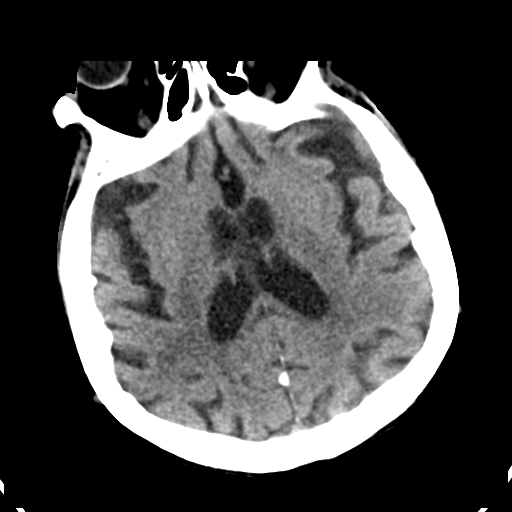
[im 22/36  brain]
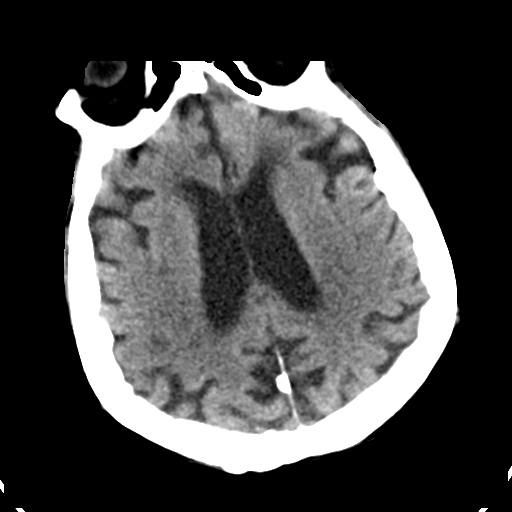
[im 25/36  brain]
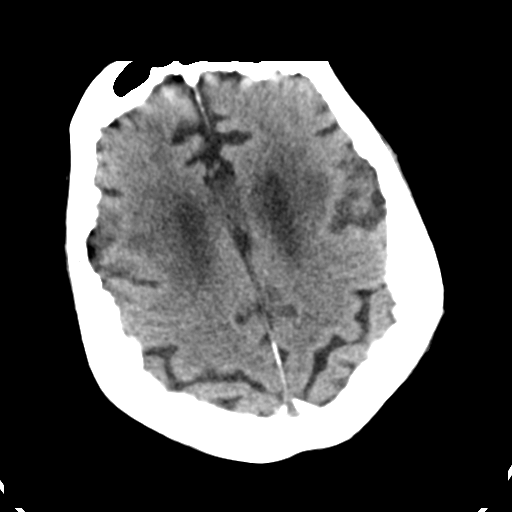
[im 25/36  bone]
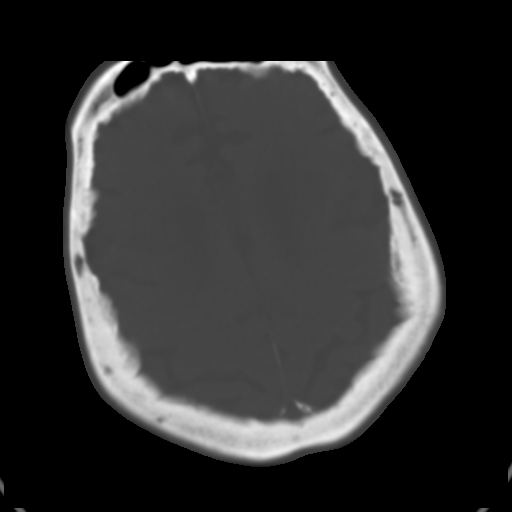
[im 28/36  brain]
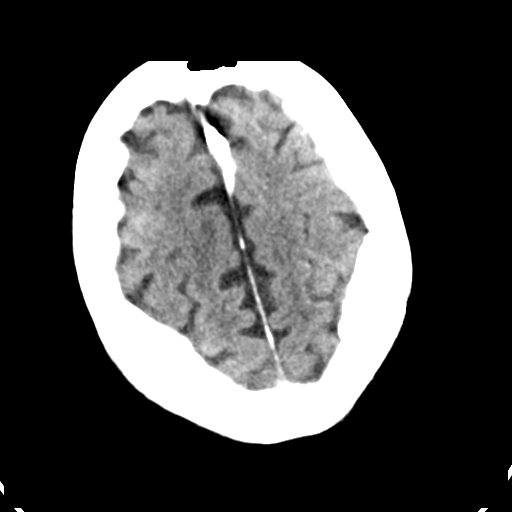
[im 31/36  brain]
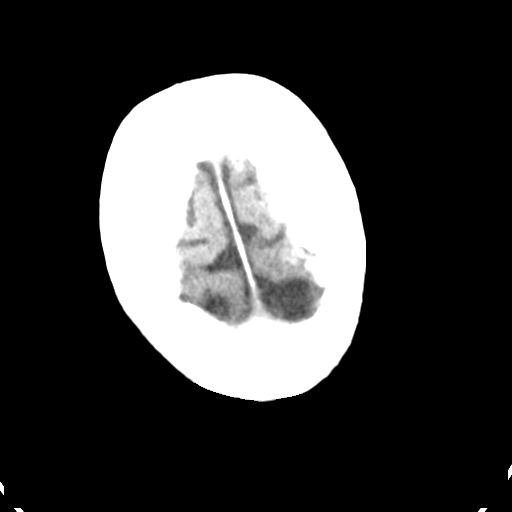
[im 33/36  brain]
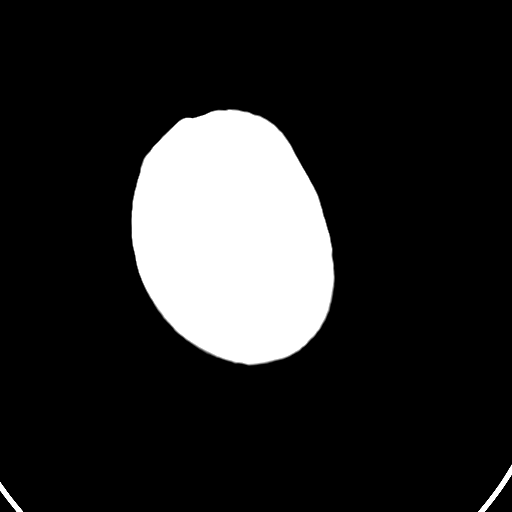

[Series 6: head 3.0 mpr sag · sagittal · 0.29mm/px · 3 of 52 slices shown]
[im 18/52  brain]
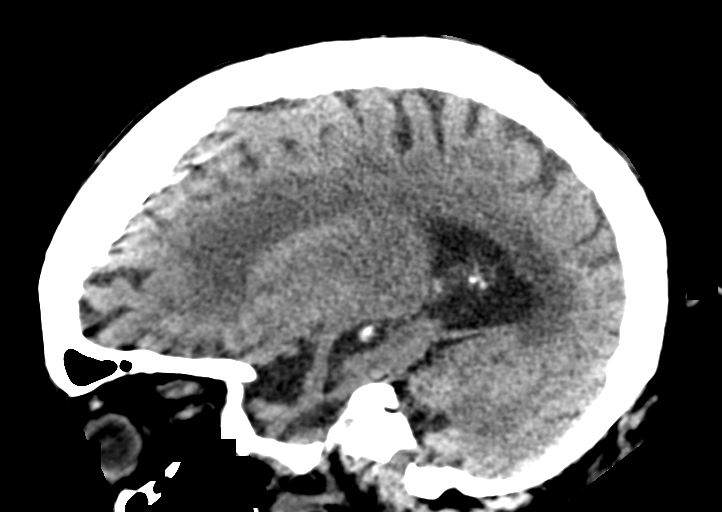
[im 26/52  brain]
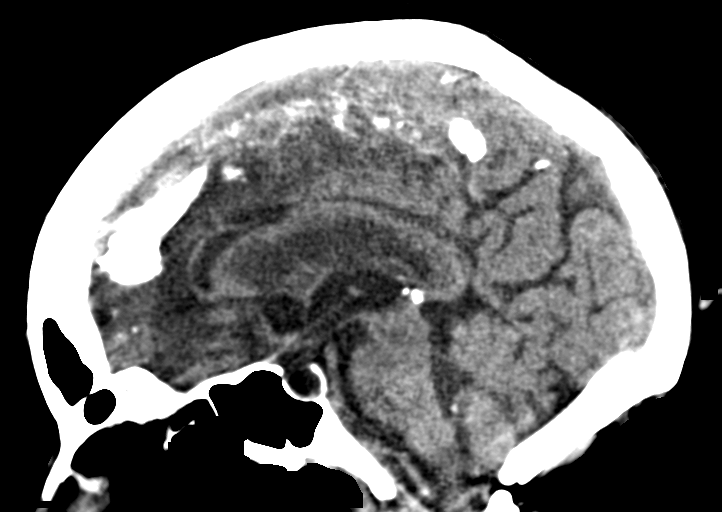
[im 35/52  brain]
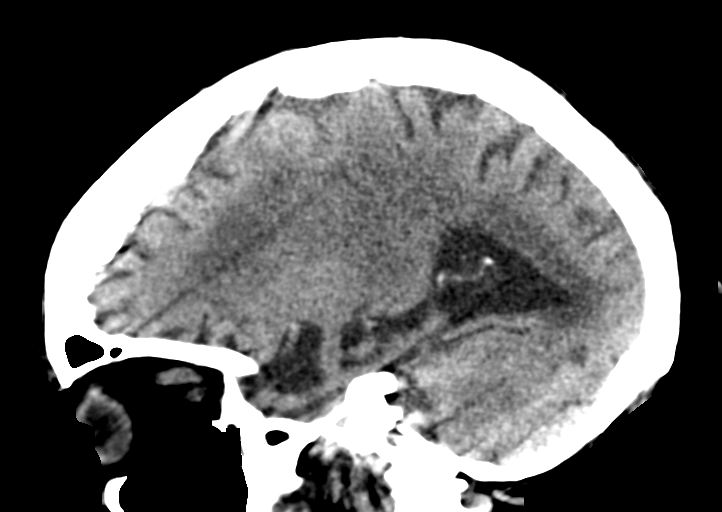

[15 of 37 positions shown; findings below may reference images not displayed]

FINDINGS: Brain:

Motion degraded examination, particularly at the level of the
vertices.

Redemonstrated moderate parenchymal atrophy which somewhat
disproportionately affects the medial temporal lobes/hippocampi.

Mild patchy hypodensity within the cerebral white matter is
nonspecific, but consistent with chronic small vessel ischemic
disease.

There is no acute intracranial hemorrhage.

No acute demarcated cortical infarct is identified.

No extra-axial fluid collection.

No evidence of intracranial mass.

No midline shift.

Partially empty sella turcica.

Vascular: No hyperdense vessel.  Atherosclerotic calcifications.

Skull: Normal. Negative for fracture or focal lesion.

Sinuses/Orbits: Visualized orbits show no acute finding. Mild
ethmoid sinus mucosal thickening. Bilateral mastoid effusions.
IMPRESSION: 1. Motion degraded examination, limiting evaluation.
2. No CT evidence of acute intracranial abnormality.
3. Moderate parenchymal atrophy which somewhat disproportionately
affects the medial temporal lobes/hippocampi.
4. Stable, mild chronic small vessel ischemic disease.
5. Mild ethmoid sinus mucosal thickening.
6. Bilateral mastoid effusions.
